# Patient Record
Sex: Male | Born: 1955 | Race: White | Hispanic: No | Marital: Married | Smoking: Former smoker
Health system: Southern US, Community
[De-identification: ages and names within clinical notes are randomized; demographics above are authoritative.]

## PROBLEM LIST (undated history)

## (undated) DIAGNOSIS — Z973 Presence of spectacles and contact lenses: Secondary | ICD-10-CM

## (undated) DIAGNOSIS — Z789 Other specified health status: Secondary | ICD-10-CM

## (undated) DIAGNOSIS — Z86010 Personal history of colonic polyps: Secondary | ICD-10-CM

## (undated) DIAGNOSIS — K604 Rectal fistula, unspecified: Secondary | ICD-10-CM

## (undated) DIAGNOSIS — Z860101 Personal history of adenomatous and serrated colon polyps: Secondary | ICD-10-CM

## (undated) HISTORY — PX: TONSILLECTOMY: SUR1361

## (undated) HISTORY — PX: COLONOSCOPY: SHX174

## (undated) HISTORY — PX: TONSILLECTOMY AND ADENOIDECTOMY: SUR1326

---

## 2003-11-08 ENCOUNTER — Ambulatory Visit (HOSPITAL_COMMUNITY): Admission: RE | Admit: 2003-11-08 | Discharge: 2003-11-08 | Payer: Self-pay | Admitting: Internal Medicine

## 2008-01-19 ENCOUNTER — Ambulatory Visit: Payer: Self-pay | Admitting: Internal Medicine

## 2008-01-19 ENCOUNTER — Ambulatory Visit (HOSPITAL_COMMUNITY): Admission: RE | Admit: 2008-01-19 | Discharge: 2008-01-19 | Payer: Self-pay | Admitting: Internal Medicine

## 2011-02-09 NOTE — Op Note (Signed)
NAMEJONA, ZAPPONE                 ACCOUNT NO.:  [id number]   MEDICAL RECORD NO.:  [id number]          PATIENT TYPE:  AMB   LOCATION:  DAY                           FACILITY:  [facility]   PHYSICIAN:  R. Roetta Sessions, M.D. DATE OF BIRTH:  30-Nov-1955   DATE OF PROCEDURE:  01/19/2008  DATE OF DISCHARGE:                               OPERATIVE REPORT   INDICATIONS FOR PROCEDURE:  This is a 55 year old gentleman who presents  for high-risk screening colonoscopy.  His last colonoscopy was just 5  years ago which was normal.  He has a marked positive family history of  a colon and non-GI cancers in his family at relatively young ages and at  least some of his relatives have been diagnosed with Lynch syndrome.  He  himself has not had genetic testing/counseling as of yet.  He has no  lower GI tract symptoms.  Colonoscopy is now being done.  This approach  has been discussed with the patient at length.  Potential risks,  benefits, alternatives, and limitations have been reviewed.  Please see  documentation in medical record.   PROCEDURE NOTE:  O2 saturation, blood pressure, pulse, and respirations  were monitored throughout the entire procedure.   CONSCIOUS SEDATION:  Versed 4 mg IV, Demerol 75 mg IV in divided doses.   INSTRUMENT:  Pentax video chip system.   FINDINGS:  Digital rectal exam revealed no abnormalities.  The scope was  placed.  Prep was good.  Colon:  Colonic mucosa was surveyed from the  rectosigmoid junction through the left transverse,  right colon to  appendiceal orifice, ileocecal valve, and cecum.  These structures were  well seen and photographed for the record.  Terminal ileum was intubated  10 cm from this level.  Scope was slowly withdrawn. All previously  mentioned mucosal surfaces were again seen.  The patient had diffusely  pigmented latent colonic mucosa, however, the remainder colonic mucosa  appeared normal as did the terminal ileum mucosa.  The scope was then  pulled down the rectum where thorough examination of rectal mucosa  including retroflex view of the anal verge demonstrated no  abnormalities.  The patient tolerated the procedure well and was  reactive to endoscopy.   IMPRESSION:  1. 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  Otherwise, the rectum and colonic mucosa appeared      normal.  2. Normal terminal ileum.   RECOMMENDATIONS:  Mr. Brannan has provided me with a typed list of  numerous family members who have succumbed to colon cancer and non-GI  cancers at relatively young ages and this is a striking family history.  Recommendations at this point, he needs at least a repeat screening  colonoscopy in 5 years.  He may need shorter-interval colonoscopy, but  Mr. Colgan is a individual who needs to go down to [facility] and have genetic  testing/counseling and my office will facilitate that process.      Jonathon Bellows, M.D.  Electronically Signed     RMR/MEDQ  D:  01/19/2008  T:  01/20/2008  Job:  [id number]   cc:   Corrie Mckusick, M.D.  Fax: [phone]

## 2011-02-12 NOTE — Op Note (Signed)
NAME:  Duane Porter, Duane Porter                           ACCOUNT NO.:  [id number]   MEDICAL RECORD NO.:  [id number]                   PATIENT TYPE:  AMB   LOCATION:  DAY                                  FACILITY:  [facility]   PHYSICIAN:  R. Roetta Sessions, M.D.              DATE OF BIRTH:  1955/11/28   DATE OF PROCEDURE:  11/08/2003  DATE OF DISCHARGE:                                 OPERATIVE REPORT   PROCEDURE:  High-risk screening colonoscopy.   ENDOSCOPIST:  Gerrit Friends. Rourk, M.D.   INDICATIONS FOR PROCEDURE:  The patient is a 55 year old gentleman with a  marked positive family history of colorectal cancer on his father's side.  His father recently underwent a surgical resection of a large precancerous  polyp.  Multiple family members, on his side, with a colorectal cancer.  [name] is devoid of any lower GI tract symptoms. He has never had a  colonoscopy.  Colonoscopy is now being done as a screening maneuver.  This  approach has been discussed with the patient at length.  The potential  risks, benefits, and alternatives have been reviewed, questions answered.  He is agreeable.  Please see my handwritten H&P for more information.   PROCEDURE NOTE:  O2 saturation, blood pressure, pulse and respirations were  monitored throughout the entire procedure.  Conscious sedation: IV Versed and Demerol in incremental doses.   INSTRUMENT:  Olympus video chip adult colonoscope.   FINDINGS:  Digital rectal exam revealed no abnormalities.   ENDOSCOPIC FINDINGS:  The prep was excellent.   RECTUM:  Examination of the rectal mucosa including the retroflexion view of  the anal verge revealed no abnormalities.   COLON:  The colonic mucosa was surveyed from the rectosigmoid junction  through the left transverse and right colon to the area of the appendiceal  orifice, ileocecal valve, and cecum.  These structures were well seen and  photographed for the record.  The terminal ileum was intubated to 10  cm.   From this level the scope was slowly withdrawn.  All previously mentioned  mucosal surfaces were again seen.  The colonic mucosa appeared entirely  normal.  A __________ pattern was somewhat prominent, but otherwise mucosa  appeared normal.  Normal terminal ileum.  The patient tolerated the  procedure well and was reacted in endoscopy.   IMPRESSION:  1. Normal rectum.  2. Normal colon.  3. Normal terminal ileum.   RECOMMENDATIONS:  1. Repeat colonoscopy in 5 years.  2. I have recommended his family members be assessed and screened as     appropriate.      ___________________________________________                                            Jonathon Bellows, M.D.   RMR/MEDQ  D:  11/08/2003  T:  11/08/2003  Job:  [id number]   cc:   R. Roetta Sessions, M.D.  [address]  [place]  [place] [address]  Fax: [phone]   Corrie Mckusick, M.D.  [address] [place]s. [place]  [place]  [place] [address]  Fax: [phone]

## 2013-02-20 ENCOUNTER — Encounter: Payer: Self-pay | Admitting: General Practice

## 2013-06-19 ENCOUNTER — Telehealth: Payer: Self-pay

## 2013-06-19 NOTE — Telephone Encounter (Signed)
Pt was referred by Dr. Sherwood Gambler for screening colonoscopy. ( He also received a letter from here also). Last TCS 01/19/2008. Family hx of colon cancer. LMOM to call and schedule OV prior to scheduling colonoscopy.

## 2013-07-02 NOTE — Telephone Encounter (Signed)
LMOM again for a call. Needs OV.

## 2013-07-03 ENCOUNTER — Telehealth: Payer: Self-pay

## 2013-07-03 NOTE — Telephone Encounter (Signed)
See separate triage dated 07/03/2013.

## 2013-07-03 NOTE — Telephone Encounter (Signed)
Gastroenterology Pre-Procedure Review  Request Date: 07/03/2013 Requesting Physician: Dr. Sherwood Gambler  Pt's last TCS was 01/19/2008 by RMR. Next was recommended in 5 years. FH+CRC Pt said he is healthy and takes no medications and has a high deductible insurance He would like to just be triaged and scheduled for colonscopy He does drink about 6-8 drinks weekly  ( Such as tequila)   PATIENT REVIEW QUESTIONS: The patient responded to the following health history questions as indicated:    1. Diabetes Melitis: no 2. Joint replacements in the past 12 months: no 3. Major health problems in the past 3 months: no 4. Has an artificial valve or MVP: no 5. Has a defibrillator: no 6. Has been advised in past to take antibiotics in advance of a procedure like teeth cleaning: no    MEDICATIONS & ALLERGIES:    Patient reports the following regarding taking any blood thinners:   Plavix? no Aspirin? no Coumadin? no  Patient confirms/reports the following medications:  No current outpatient prescriptions on file.   No current facility-administered medications for this visit.    Patient confirms/reports the following allergies:  No Known Allergies  No orders of the defined types were placed in this encounter.    AUTHORIZATION INFORMATION Primary Insurance:  ID #:   Group #:  Pre-Cert / Auth required:  Pre-Cert / Auth #:   Secondary Insurance:   ID #:   Group #: Pre-Cert / Auth required Pre-Cert / Auth #:   SCHEDULE INFORMATION: Procedure has been scheduled as follows:  Date:            Time:   Location:   This Gastroenterology Pre-Precedure Review Form is being routed to the following provider(s): R. Roetta Sessions, MD

## 2013-07-03 NOTE — Telephone Encounter (Signed)
LMOM to call.

## 2013-07-04 ENCOUNTER — Other Ambulatory Visit: Payer: Self-pay

## 2013-07-04 DIAGNOSIS — Z1211 Encounter for screening for malignant neoplasm of colon: Secondary | ICD-10-CM

## 2013-07-04 MED ORDER — PEG-KCL-NACL-NASULF-NA ASC-C 100 G PO SOLR: 1.0000 | ORAL | Status: DC

## 2013-07-04 NOTE — Telephone Encounter (Signed)
Pt called back and is scheduled for 1:00 PM on 07/19/2013. Instructions mailed to pt and Rx sent to the pharmacy.

## 2013-07-04 NOTE — Telephone Encounter (Signed)
OK to triage him. Previously did well with conscious sedation.

## 2013-07-10 ENCOUNTER — Encounter (HOSPITAL_COMMUNITY): Payer: Self-pay | Admitting: Pharmacy Technician

## 2013-07-11 ENCOUNTER — Telehealth: Payer: Self-pay

## 2013-07-11 NOTE — Telephone Encounter (Signed)
I called [employer] at [phone] and spoke to [place] N who said that a PA is not required for screening colonoscopy.

## 2013-07-13 ENCOUNTER — Telehealth: Payer: Self-pay

## 2013-07-13 NOTE — Telephone Encounter (Signed)
Noted  

## 2013-07-13 NOTE — Telephone Encounter (Signed)
LATE ENTRY:  Pt came by the office at 4:45 pm yesterday and said he would like to cancel his appt for colonoscopy on 07/19/2013. He said he will call me in Jan 2015 and reschedule. He said due to his insurance now, he would have to pay a lot and he will have a change in Jan. He is not having any problems. I took him off my schedule. Put on my reminder list to call in Jan if I do not hear from him.

## 2013-07-13 NOTE — Telephone Encounter (Signed)
Letter faxed to Dr. Sherwood Gambler of the cancellation.

## 2013-07-13 NOTE — Telephone Encounter (Signed)
Kim is aware

## 2013-07-19 ENCOUNTER — Encounter (HOSPITAL_COMMUNITY): Admission: RE | Payer: Self-pay | Source: Ambulatory Visit

## 2013-07-19 ENCOUNTER — Ambulatory Visit (HOSPITAL_COMMUNITY): Admission: RE | Admit: 2013-07-19 | Payer: 59 | Source: Ambulatory Visit | Admitting: Internal Medicine

## 2013-07-19 SURGERY — COLONOSCOPY
Anesthesia: Moderate Sedation

## 2013-07-24 ENCOUNTER — Ambulatory Visit: Payer: Self-pay | Admitting: Gastroenterology

## 2013-10-10 NOTE — Telephone Encounter (Signed)
Letter reminder mailed to pt to call and schedule colonoscopy.  

## 2013-10-11 ENCOUNTER — Other Ambulatory Visit: Payer: Self-pay

## 2013-10-11 ENCOUNTER — Telehealth: Payer: Self-pay | Admitting: Internal Medicine

## 2013-10-11 DIAGNOSIS — Z1211 Encounter for screening for malignant neoplasm of colon: Secondary | ICD-10-CM

## 2013-10-11 NOTE — Telephone Encounter (Signed)
Pt called to speak with the nurse that schedules colonoscopies and I told him that DS wasn't available at the moment and I could transfer him to her VM. He wanted to know how long it would take for her to become available. I told him that she was with a patient and I don't know how long that would take. He said that he would stay on hold because he isn't always at his desk either. I place patient on hold and after a few minutes of waiting I had to transfer him to VM.

## 2013-10-12 ENCOUNTER — Telehealth: Payer: Self-pay

## 2013-10-12 MED ORDER — PEG-KCL-NACL-NASULF-NA ASC-C 100 G PO SOLR: 1.0000 | ORAL | Status: DC

## 2013-10-12 NOTE — Telephone Encounter (Signed)
Called pt. He said he did not have the genetic testing. He is not really sure what happened, he thinks that he just forgot about it.

## 2013-10-12 NOTE — Telephone Encounter (Signed)
Gastroenterology Pre-Procedure Review  Request Date:10/11/2013 Requesting Physician: just time for next colonoscopy Last one was 01/19/2008 by Dr. Gala Romney and pt has family hx of colon cancer and family hx of precancerous polyps Not having any problems  PATIENT REVIEW QUESTIONS: The patient responded to the following health history questions as indicated:    1. Diabetes Melitis: no 2. Joint replacements in the past 12 months: no 3. Major health problems in the past 3 months: no 4. Has an artificial valve or MVP: no 5. Has a defibrillator: no 6. Has been advised in past to take antibiotics in advance of a procedure like teeth cleaning: no    MEDICATIONS & ALLERGIES:    Patient reports the following regarding taking any blood thinners:   Plavix? no Aspirin? no Coumadin? no  Patient confirms/reports the following medications:  Current Outpatient Prescriptions  Medication Sig Dispense Refill  . peg 3350 powder (MOVIPREP) 100 G SOLR Take 1 kit (200 g total) by mouth as directed.  1 kit  0   No current facility-administered medications for this visit.    Patient confirms/reports the following allergies:  No Known Allergies  No orders of the defined types were placed in this encounter.    AUTHORIZATION INFORMATION Primary Insurance:   ID #:  Group #:  Pre-Cert / Auth required:  Pre-Cert / Auth #:   Secondary Insurance:  ID #: Group #:  Pre-Cert / Auth required: Pre-Cert / Auth #:   SCHEDULE INFORMATION: Procedure has been scheduled as follows:  Date: 10/29/2013              Time:  8:30 AM Location: [facility] Short Stay  This Gastroenterology Pre-Precedure Review Form is being routed to the following provider(s): R. Garfield Cornea, MD

## 2013-10-12 NOTE — Telephone Encounter (Signed)
OK. Do we know if he ever went to [facility] for genetic testing due to his family history and per RMR recommendations after his last TCS?

## 2013-10-15 ENCOUNTER — Encounter (HOSPITAL_COMMUNITY): Payer: Self-pay | Admitting: Pharmacy Technician

## 2013-10-15 NOTE — Telephone Encounter (Signed)
OK to schedule.  Let's wait for TCS and RMR's input on referral for genetic testing.

## 2013-10-18 NOTE — Telephone Encounter (Signed)
Instructions mailed to pt. Per Jimmey Ralph at [place] the Rx is still on profile for the previous Movie Prep.

## 2013-10-18 NOTE — Telephone Encounter (Signed)
See separate triage.  

## 2013-10-29 ENCOUNTER — Encounter (HOSPITAL_COMMUNITY)
Admission: RE | Disposition: A | Discharge status: Home Health | Payer: Self-pay | Source: Ambulatory Visit | Attending: Internal Medicine

## 2013-10-29 ENCOUNTER — Encounter (HOSPITAL_COMMUNITY): Payer: Self-pay

## 2013-10-29 ENCOUNTER — Ambulatory Visit (HOSPITAL_COMMUNITY)
Admission: RE | Admit: 2013-10-29 | Discharge: 2013-10-29 | Disposition: A | Discharge status: Home Health | Payer: 59 | Source: Ambulatory Visit | Attending: Internal Medicine | Admitting: Internal Medicine

## 2013-10-29 DIAGNOSIS — D126 Benign neoplasm of colon, unspecified: Secondary | ICD-10-CM | POA: Insufficient documentation

## 2013-10-29 DIAGNOSIS — Z800 Family history of malignant neoplasm of digestive organs: Secondary | ICD-10-CM

## 2013-10-29 DIAGNOSIS — Z1211 Encounter for screening for malignant neoplasm of colon: Secondary | ICD-10-CM

## 2013-10-29 HISTORY — DX: Other specified health status: Z78.9

## 2013-10-29 HISTORY — PX: COLONOSCOPY: SHX5424

## 2013-10-29 SURGERY — COLONOSCOPY
Anesthesia: Moderate Sedation

## 2013-10-29 MED ORDER — ONDANSETRON HCL 4 MG/2ML IJ SOLN
INTRAMUSCULAR | Status: AC
  Filled 2013-10-29: qty 2

## 2013-10-29 MED ORDER — MIDAZOLAM HCL 5 MG/5ML IJ SOLN
INTRAMUSCULAR | Status: AC
  Filled 2013-10-29: qty 10

## 2013-10-29 MED ORDER — SODIUM CHLORIDE 0.9 % IV SOLN
INTRAVENOUS | Status: DC
  Administered 2013-10-29: 08:00:00 via INTRAVENOUS

## 2013-10-29 MED ORDER — MIDAZOLAM HCL 5 MG/5ML IJ SOLN
INTRAMUSCULAR | Status: DC | PRN
  Administered 2013-10-29 (×2): 1 mg via INTRAVENOUS
  Administered 2013-10-29: 2 mg via INTRAVENOUS

## 2013-10-29 MED ORDER — MEPERIDINE HCL 100 MG/ML IJ SOLN
INTRAMUSCULAR | Status: AC
  Filled 2013-10-29: qty 2

## 2013-10-29 MED ORDER — ONDANSETRON HCL 4 MG/2ML IJ SOLN
INTRAMUSCULAR | Status: DC | PRN
  Administered 2013-10-29: 4 mg via INTRAVENOUS

## 2013-10-29 MED ORDER — STERILE WATER FOR IRRIGATION IR SOLN
Status: DC | PRN
  Administered 2013-10-29: 09:00:00

## 2013-10-29 MED ORDER — MEPERIDINE HCL 100 MG/ML IJ SOLN
INTRAMUSCULAR | Status: DC | PRN
  Administered 2013-10-29: 50 mg via INTRAVENOUS
  Administered 2013-10-29: 25 mg via INTRAVENOUS

## 2013-10-29 NOTE — Op Note (Signed)
[facility] [address] [place], [address]   COLONOSCOPY PROCEDURE REPORT  PATIENT: Duane, Porter  MR#:         [id number] BIRTHDATE: [birth year] , 62  yrs. old GENDER: Male ENDOSCOPIST: R.  Garfield Cornea, MD FACP FACG REFERRED BY:  Kerin Perna, M.D. PROCEDURE DATE:  10/29/2013 PROCEDURE:     Ileocolonoscopy with biopsy  INDICATIONS: High-risk screening - Family history of colonic polyps and colon cancer  INFORMED CONSENT:  The risks, benefits, alternatives and imponderables including but not limited to bleeding, perforation as well as the possibility of a missed lesion have been reviewed.  The potential for biopsy, lesion removal, etc. have also been discussed.  Questions have been answered.  All parties agreeable. Please see the history and physical in the medical record for more information.  MEDICATIONS: Versed 4 mg IV and Demerol 75 mg IV in divided doses. Zofran 4 mg IV  DESCRIPTION OF PROCEDURE:  After a digital rectal exam was performed, the EC-3890Li ([id number])  colonoscope was advanced from the anus through the rectum and colon to the area of the cecum, ileocecal valve and appendiceal orifice.  The cecum was deeply intubated.  These structures were well-seen and photographed for the record.  From the level of the cecum and ileocecal valve, the scope was slowly and cautiously withdrawn.  The mucosal surfaces were carefully surveyed utilizing scope tip deflection to facilitate fold flattening as needed.  The scope was pulled down into the rectum where a thorough examination including retroflexion was performed.    FINDINGS:  Adequate preparation. Normal rectum. (1) diminutive polyp in the mid descending segment; (1) diminutive polyp in the base the cecum; otherwise, the remainder of the colonic mucosa appeared normal. The distal 5 cm of terminal ileal mucosa also appeared normal.  THERAPEUTIC / DIAGNOSTIC MANEUVERS PERFORMED:  The  above-mentioned polyps were cold biopsied/removed.  COMPLICATIONS: None  CECAL WITHDRAWAL TIME:  11 minutes  IMPRESSION:  Colonic polyps  RECOMMENDATIONS:  Follow up on pathology. Recommend seeing a genetic counselor to consider genetic testing, if appropriate, given family history _______________________________ eSigned:  R. Garfield Cornea, MD FACP [facility] 10/29/2013 9:11 AM   CC:

## 2013-10-29 NOTE — H&P (Signed)
  Primary Care Physician:  Glo Herring., MD Primary Gastroenterologist:  Dr. Gala Romney  Pre-Procedure History & Physical: HPI:  Duane Porter is a 58 y.o. male is here for a screening colonoscopy. Father with polyps. Paternal aunt with colon cancer. No GI symptoms. Negative colonoscopy a number of years ago.  Past Medical History  Diagnosis Date  . Medical history non-contributory     Past Surgical History  Procedure Laterality Date  . Tonsillectomy      at 58 years old  . Colonoscopy  2010, 2005    Prior to Admission medications   Not on File    Allergies as of 10/11/2013  . (No Known Allergies)    Family History  Problem Relation Age of Onset  . Colon cancer Other   . Colon cancer Paternal Aunt     History   Social History  . Marital Status: Married    Spouse Name: N/A    Number of Children: N/A  . Years of Education: N/A   Occupational History  . Not on file.   Social History Main Topics  . Smoking status: Never Smoker   . Smokeless tobacco: Not on file  . Alcohol Use: No  . Drug Use: No  . Sexual Activity: Not on file   Other Topics Concern  . Not on file   Social History Narrative  . No narrative on file    Review of Systems: See HPI, otherwise negative ROS  Physical Exam: BP 119/81  Pulse 61  Temp(Src) 97.9 F (36.6 C) (Oral)  Resp 16  Ht 5\' 11"  (1.803 m)  Wt 163 lb (73.936 kg)  BMI 22.74 kg/m2  SpO2 95% General:   Alert,  Well-developed, well-nourished, pleasant and cooperative in NAD Head:  Normocephalic and atraumatic. Eyes:  Sclera clear, no icterus.   Conjunctiva pink. Ears:  Normal auditory acuity. Nose:  No deformity, discharge,  or lesions. Mouth:  No deformity or lesions, dentition normal. Neck:  Supple; no masses or thyromegaly. Lungs:  Clear throughout to auscultation.   No wheezes, crackles, or rhonchi. No acute distress. Heart:  Regular rate and rhythm; no murmurs, clicks, rubs,  or gallops. Abdomen:  Soft, nontender  and nondistended. No masses, hepatosplenomegaly or hernias noted. Normal bowel sounds, without guarding, and without rebound.   Msk:  Symmetrical without gross deformities. Normal posture. Pulses:  Normal pulses noted. Extremities:  Without clubbing or edema. Neurologic:  Alert and  oriented x4;  grossly normal neurologically. Skin:  Intact without significant lesions or rashes. Cervical Nodes:  No significant cervical adenopathy. Psych:  Alert and cooperative. Normal mood and affect.  Impression/Plan: Duane Porter is now here to undergo a screening colonoscopy.  Risks, benefits, limitations, imponderables and alternatives regarding colonoscopy have been reviewed with the patient. Questions have been answered. All parties agreeable.

## 2013-10-29 NOTE — Discharge Instructions (Addendum)
Colonoscopy Discharge Instructions  Read the instructions outlined below and refer to this sheet in the next few weeks. These discharge instructions provide you with general information on caring for yourself after you leave the hospital. Your doctor may also give you specific instructions. While your treatment has been planned according to the most current medical practices available, unavoidable complications occasionally occur. If you have any problems or questions after discharge, call Dr. Gala Romney at [phone]. ACTIVITY  You may resume your regular activity, but move at a slower pace for the next 24 hours.   Take frequent rest periods for the next 24 hours.   Walking will help get rid of the air and reduce the bloated feeling in your belly (abdomen).   No driving for 24 hours (because of the medicine (anesthesia) used during the test).    Do not sign any important legal documents or operate any machinery for 24 hours (because of the anesthesia used during the test).  NUTRITION  Drink plenty of fluids.   You may resume your normal diet as instructed by your doctor.   Begin with a light meal and progress to your normal diet. Heavy or fried foods are harder to digest and may make you feel sick to your stomach (nauseated).   Avoid alcoholic beverages for 24 hours or as instructed.  MEDICATIONS  You may resume your normal medications unless your doctor tells you otherwise.  WHAT YOU CAN EXPECT TODAY  Some feelings of bloating in the abdomen.   Passage of more gas than usual.   Spotting of blood in your stool or on the toilet paper.  IF YOU HAD POLYPS REMOVED DURING THE COLONOSCOPY:  No aspirin products for 7 days or as instructed.   No alcohol for 7 days or as instructed.   Eat a soft diet for the next 24 hours.  FINDING OUT THE RESULTS OF YOUR TEST Not all test results are available during your visit. If your test results are not back during the visit, make an appointment  with your caregiver to find out the results. Do not assume everything is normal if you have not heard from your caregiver or the medical facility. It is important for you to follow up on all of your test results.  SEEK IMMEDIATE MEDICAL ATTENTION IF:  You have more than a spotting of blood in your stool.   Your belly is swollen (abdominal distention).   You are nauseated or vomiting.   You have a temperature over 101.   You have abdominal pain or discomfort that is severe or gets worse throughout the day.     Get genetic testing as previously recommended  Polyp information provided  Further recommendations to follow pending review of pathology  Colon Polyps Polyps are lumps of extra tissue growing inside the body. Polyps can grow in the large intestine (colon). Most colon polyps are noncancerous (benign). However, some colon polyps can become cancerous over time. Polyps that are larger than a pea may be harmful. To be safe, caregivers remove and test all polyps. CAUSES  Polyps form when mutations in the genes cause your cells to grow and divide even though no more tissue is needed. RISK FACTORS There are a number of risk factors that can increase your chances of getting colon polyps. They include:  Being older than 50 years.  Family history of colon polyps or colon cancer.  Long-term colon diseases, such as colitis or Crohn disease.  Being overweight.  Smoking.  Being  inactive.  Drinking too much alcohol. SYMPTOMS  Most small polyps do not cause symptoms. If symptoms are present, they may include:  Blood in the stool. The stool may look dark red or black.  Constipation or diarrhea that lasts longer than 1 week. DIAGNOSIS People often do not know they have polyps until their caregiver finds them during a regular checkup. Your caregiver can use 4 tests to check for polyps:  Digital rectal exam. The caregiver wears gloves and feels inside the rectum. This test would  find polyps only in the rectum.  Barium enema. The caregiver puts a liquid called barium into your rectum before taking X-rays of your colon. Barium makes your colon look white. Polyps are dark, so they are easy to see in the X-ray pictures.  Sigmoidoscopy. A thin, flexible tube (sigmoidoscope) is placed into your rectum. The sigmoidoscope has a light and tiny camera in it. The caregiver uses the sigmoidoscope to look at the last third of your colon.  Colonoscopy. This test is like sigmoidoscopy, but the caregiver looks at the entire colon. This is the most common method for finding and removing polyps. TREATMENT  Any polyps will be removed during a sigmoidoscopy or colonoscopy. The polyps are then tested for cancer. PREVENTION  To help lower your risk of getting more colon polyps:  Eat plenty of fruits and vegetables. Avoid eating fatty foods.  Do not smoke.  Avoid drinking alcohol.  Exercise every day.  Lose weight if recommended by your caregiver.  Eat plenty of calcium and folate. Foods that are rich in calcium include milk, cheese, and broccoli. Foods that are rich in folate include chickpeas, kidney beans, and spinach. HOME CARE INSTRUCTIONS Keep all follow-up appointments as directed by your caregiver. You may need periodic exams to check for polyps. SEEK MEDICAL CARE IF: You notice bleeding during a bowel movement. Document Released: 06/09/2004 Document Revised: 12/06/2011 Document Reviewed: 11/23/2011 [facility] Patient Information 2014 [place].

## 2013-10-31 ENCOUNTER — Telehealth: Payer: Self-pay | Admitting: Internal Medicine

## 2013-10-31 ENCOUNTER — Encounter: Payer: Self-pay | Admitting: Internal Medicine

## 2013-10-31 NOTE — Telephone Encounter (Signed)
Pt came into office a few minutes before closing looking to speak with RMR. I told patient that RMR was not in the office and how could I help him. He said that he had a procedure the day before and was some what mouthy to RMR about removing polyps. Patient said that he doesn't remember anything just what his wife told him. He said the anesthesia must have made him that way and he wanted to apologize to RMR and he was OK about him removing the polyps. I assured patient that I was sure the our doctors get this a lot due to anesthesia and not to worry and he would be hearing from [place] soon regarding his results. Patient said thank you and he left the office.

## 2013-10-31 NOTE — Telephone Encounter (Signed)
Interesting report. I do not remember any irregularities or comments from him. Patient should not worry about his behavior at the hospital

## 2013-11-01 ENCOUNTER — Encounter (HOSPITAL_COMMUNITY): Payer: Self-pay | Admitting: Internal Medicine

## 2018-07-03 ENCOUNTER — Encounter: Payer: Self-pay | Admitting: Internal Medicine

## 2018-10-30 ENCOUNTER — Ambulatory Visit (INDEPENDENT_AMBULATORY_CARE_PROVIDER_SITE_OTHER): Payer: Self-pay

## 2018-10-30 DIAGNOSIS — Z1211 Encounter for screening for malignant neoplasm of colon: Secondary | ICD-10-CM

## 2018-10-30 MED ORDER — NA SULFATE-K SULFATE-MG SULF 17.5-3.13-1.6 GM/177ML PO SOLN: 1.0000 | ORAL | 0 refills | Status: DC

## 2018-10-30 NOTE — Progress Notes (Signed)
Gastroenterology Pre-Procedure Review  Request Date:10/30/18 Requesting Physician: Dr.Fusco- last tcs- 10/29/13- RMR- benign colonic mucosa  +FH CRC  PATIENT REVIEW QUESTIONS: The patient responded to the following health history questions as indicated:    1. Diabetes Melitis: no 2. Joint replacements in the past 12 months: no 3. Major health problems in the past 3 months: no 4. Has an artificial valve or MVP: no 5. Has a defibrillator: no 6. Has been advised in past to take antibiotics in advance of a procedure like teeth cleaning: no 7. Family history of colon cancer: yes (father )  51. Alcohol Use: yes (socially) 9. History of sleep apnea: no  10. History of coronary artery or other vascular stents placed within the last 12 months: no 11. History of any prior anesthesia complications: no    MEDICATIONS & ALLERGIES:    Patient reports the following regarding taking any blood thinners:   Plavix? no Aspirin? no Coumadin? no Brilinta? no Xarelto? no Eliquis? no Pradaxa? no Savaysa? no Effient? no  Patient confirms/reports the following medications:  Current Outpatient Medications  Medication Sig Dispense Refill  . sulfamethoxazole-trimethoprim (BACTRIM DS,SEPTRA DS) 800-160 MG tablet TAKE 2 TABLETS BY MOUTH TWICE DAILY FOR 10 14 DAYS     No current facility-administered medications for this visit.     Patient confirms/reports the following allergies:  No Known Allergies  No orders of the defined types were placed in this encounter.   AUTHORIZATION INFORMATION Primary Insurance: [place],  [place] #: [id number] Pre-Cert / Josem Kaufmann required: no   SCHEDULE INFORMATION: Procedure has been scheduled as follows:  Date: 12/29/18, Time: 12:00 Location: [facility] Dr.Rourk  This Gastroenterology Pre-Precedure Review Form is being routed to the following provider(s): Walden Field NP

## 2018-10-30 NOTE — Patient Instructions (Addendum)
Duane Porter  1956/07/18 MRN: [id number]     Procedure Date: 02/21/2019 Time to register: 1:00pm Place to register: [facility] Procedure Time: 2:00pm Scheduled provider: R. Garfield Cornea, MD    PREPARATION FOR COLONOSCOPY WITH SUPREP BOWEL PREP KIT  Note: Suprep Bowel Prep Kit is a split-dose (2day) regimen. Consumption of BOTH 6-ounce bottles is required for a complete prep.  Please notify us immediately if you are diabetic, take iron supplements, or if you are on Coumadin or any other blood thinners.                                                                                                                                                 2 DAYS BEFORE PROCEDURE:  DATE: 02/19/2019   DAY: Monday Begin clear liquid diet AFTER your lunch meal. NO SOLID FOODS after this point.  1 DAY BEFORE PROCEDURE:  DATE: 02/20/2019   DAY: Tuesday Continue clear liquids the entire day - NO SOLID FOOD.    At 6:00pm: Complete steps 1 through 4 below, using ONE (1) 6-ounce bottle, before going to bed. Step 1:  Pour ONE (1) 6-ounce bottle of SUPREP liquid into the mixing container.  Step 2:  Add cool drinking water to the 16 ounce line on the container and mix.  Note: Dilute the solution concentrate as directed prior to use. Step 3:  DRINK ALL the liquid in the container. Step 4:  You MUST drink an additional two (2) or more 16 ounce containers of water over the next one (1) hour.   Continue clear liquids.  DAY OF PROCEDURE:   DATE: 02/21/2019   DAY: Wednesday If you take medications for your heart, blood pressure, or breathing, you may take these medications.   5 hours before your procedure at 9:00am Step 1:  Pour ONE (1) 6-ounce bottle of SUPREP liquid into the mixing container.  Step 2:  Add cool drinking water to the 16 ounce line on the container and mix.  Note: Dilute the solution concentrate as directed prior to use. Step 3:  DRINK ALL the liquid in the container. Step 4:  You  MUST drink an additional two (2) or more 16 ounce containers of water over the next one (1) hour. You MUST complete the final glass of water at least 3 hours before your colonoscopy.   Nothing by mouth past 11:00am  You may take your morning medications with sip of water unless we have instructed otherwise.    Please see below for Dietary Information.  CLEAR LIQUIDS INCLUDE:  Water Jello (NOT red in color)   Ice Popsicles (NOT red in color)   Tea (sugar ok, no milk/cream) Powdered fruit flavored drinks  Coffee (sugar ok, no milk/cream) Gatorade/ Lemonade/ Kool-Aid  (NOT red in color)   Juice: apple, white grape, white cranberry Soft drinks  Clear bullion, consomme, broth (fat free beef/chicken/vegetable)  Carbonated beverages (any kind)  Strained chicken noodle soup Hard Candy   Remember: Clear liquids are liquids that will allow you to see your fingers on the other side of a clear glass. Be sure liquids are NOT red in color, and not cloudy, but CLEAR.  DO NOT EAT OR DRINK ANY OF THE FOLLOWING:  Dairy products of any kind   Cranberry juice Tomato juice / V8 juice   Grapefruit juice Orange juice     Red grape juice  Do not eat any solid foods, including such foods as: cereal, oatmeal, yogurt, fruits, vegetables, creamed soups, eggs, bread, crackers, pureed foods in a blender, etc.   HELPFUL HINTS FOR DRINKING PREP SOLUTION:   Make sure prep is extremely cold. Mix and refrigerate the the morning of the prep. You may also put in the freezer.   You may try mixing some Crystal Light or Country Time Lemonade if you prefer. Mix in small amounts; add more if necessary.  Try drinking through a straw  Rinse mouth with water or a mouthwash between glasses, to remove after-taste.  Try sipping on a cold beverage /ice/ popsicles between glasses of prep.  Place a piece of sugar-free hard candy in mouth between glasses.  If you become nauseated, try consuming smaller amounts, or stretch  out the time between glasses. Stop for 30-60 minutes, then slowly start back drinking.     OTHER INSTRUCTIONS  You will need a responsible adult at least 61 years of age to accompany you and drive you home. This person must remain in the waiting room during your procedure. The hospital will cancel your procedure if you do not have a responsible adult with you.   1. Wear loose fitting clothing that is easily removed. 2. Leave jewelry and other valuables at home.  3. Remove all body piercing jewelry and leave at home. 4. Total time from sign-in until discharge is approximately 2-3 hours. 5. You should go home directly after your procedure and rest. You can resume normal activities the day after your procedure. 6. The day of your procedure you should not:  Drive  Make legal decisions  Operate machinery  Drink alcohol  Return to work   You may call the office (Dept: [phone]) before 5:00pm, or page the doctor on call ([phone]) after 5:00pm, for further instructions, if necessary.   Insurance Information YOU WILL NEED TO CHECK WITH YOUR INSURANCE COMPANY FOR THE BENEFITS OF COVERAGE YOU HAVE FOR THIS PROCEDURE.  UNFORTUNATELY, NOT ALL INSURANCE COMPANIES HAVE BENEFITS TO COVER ALL OR PART OF THESE TYPES OF PROCEDURES.  IT IS YOUR RESPONSIBILITY TO CHECK YOUR BENEFITS, HOWEVER, WE WILL BE GLAD TO ASSIST YOU WITH ANY CODES YOUR INSURANCE COMPANY MAY NEED.    PLEASE NOTE THAT MOST INSURANCE COMPANIES WILL NOT COVER A SCREENING COLONOSCOPY FOR PEOPLE UNDER THE AGE OF 50  IF YOU HAVE BCBS INSURANCE, YOU MAY HAVE BENEFITS FOR A SCREENING COLONOSCOPY BUT IF POLYPS ARE FOUND THE DIAGNOSIS WILL CHANGE AND THEN YOU MAY HAVE A DEDUCTIBLE THAT WILL NEED TO BE MET. SO PLEASE MAKE SURE YOU CHECK YOUR BENEFITS FOR A SCREENING COLONOSCOPY AS WELL AS A DIAGNOSTIC COLONOSCOPY.      

## 2018-10-31 NOTE — Progress Notes (Signed)
Ok to schedule.

## 2018-12-18 NOTE — Progress Notes (Signed)
Pt's colonoscopy was re-scheduled due to CDC guidelines regarding the coronavirus.  Pt aware that we are mailing out new instructions.  Endo notified.

## 2019-02-12 ENCOUNTER — Telehealth: Payer: Self-pay | Admitting: Internal Medicine

## 2019-02-12 NOTE — Telephone Encounter (Signed)
Pt was returning a call from [facility]. Please call him back at [phone]

## 2019-02-12 NOTE — Telephone Encounter (Signed)
Called pt back to confirm procedure for next week and to inform him about COVID 19 testing.

## 2019-02-16 ENCOUNTER — Other Ambulatory Visit: Payer: Self-pay

## 2019-02-16 ENCOUNTER — Other Ambulatory Visit (HOSPITAL_COMMUNITY)
Admission: RE | Admit: 2019-02-16 | Discharge: 2019-02-16 | Disposition: A | Discharge status: Home / Self Care | Payer: BC Managed Care – PPO | Source: Ambulatory Visit | Attending: Internal Medicine | Admitting: Internal Medicine

## 2019-02-16 DIAGNOSIS — Z1159 Encounter for screening for other viral diseases: Secondary | ICD-10-CM | POA: Diagnosis not present

## 2019-02-16 DIAGNOSIS — Z01812 Encounter for preprocedural laboratory examination: Secondary | ICD-10-CM | POA: Diagnosis present

## 2019-02-17 LAB — NOVEL CORONAVIRUS, NAA (HOSP ORDER, SEND-OUT TO REF LAB; TAT 18-24 HRS): SARS-CoV-2, NAA: NOT DETECTED

## 2019-02-20 ENCOUNTER — Telehealth: Payer: Self-pay | Admitting: *Deleted

## 2019-02-20 NOTE — Telephone Encounter (Signed)
Patient called back and stated he is not able to move up on schedule. Called endo and made aware

## 2019-02-20 NOTE — Telephone Encounter (Signed)
LMTCB for pt. Duane Porter in endo wants [place] to see if patient is willing to move procedure time up tomorrow on schedule to 8:15am.

## 2019-02-21 ENCOUNTER — Encounter (HOSPITAL_COMMUNITY): Payer: Self-pay | Admitting: *Deleted

## 2019-02-21 ENCOUNTER — Ambulatory Visit (HOSPITAL_COMMUNITY)
Admission: RE | Admit: 2019-02-21 | Discharge: 2019-02-21 | Disposition: A | Discharge status: Home / Self Care | Payer: BC Managed Care – PPO | Attending: Internal Medicine | Admitting: Internal Medicine

## 2019-02-21 ENCOUNTER — Encounter (HOSPITAL_COMMUNITY)
Admission: RE | Disposition: A | Discharge status: Home / Self Care | Payer: Self-pay | Source: Home / Self Care | Attending: Internal Medicine

## 2019-02-21 ENCOUNTER — Other Ambulatory Visit: Payer: Self-pay

## 2019-02-21 DIAGNOSIS — Z8371 Family history of colonic polyps: Secondary | ICD-10-CM | POA: Diagnosis not present

## 2019-02-21 DIAGNOSIS — Z1211 Encounter for screening for malignant neoplasm of colon: Secondary | ICD-10-CM | POA: Diagnosis not present

## 2019-02-21 DIAGNOSIS — Z800 Family history of malignant neoplasm of digestive organs: Secondary | ICD-10-CM | POA: Insufficient documentation

## 2019-02-21 DIAGNOSIS — D120 Benign neoplasm of cecum: Secondary | ICD-10-CM | POA: Insufficient documentation

## 2019-02-21 DIAGNOSIS — K635 Polyp of colon: Secondary | ICD-10-CM | POA: Diagnosis not present

## 2019-02-21 DIAGNOSIS — D122 Benign neoplasm of ascending colon: Secondary | ICD-10-CM | POA: Diagnosis not present

## 2019-02-21 HISTORY — PX: POLYPECTOMY: SHX5525

## 2019-02-21 HISTORY — PX: COLONOSCOPY: SHX5424

## 2019-02-21 SURGERY — COLONOSCOPY
Anesthesia: Moderate Sedation

## 2019-02-21 MED ORDER — MIDAZOLAM HCL 5 MG/5ML IJ SOLN
INTRAMUSCULAR | Status: DC | PRN
  Administered 2019-02-21: 2 mg via INTRAVENOUS
  Administered 2019-02-21: 1 mg via INTRAVENOUS
  Administered 2019-02-21: 2 mg via INTRAVENOUS
  Administered 2019-02-21 (×2): 1 mg via INTRAVENOUS

## 2019-02-21 MED ORDER — SODIUM CHLORIDE 0.9 % IV SOLN
INTRAVENOUS | Status: DC
  Administered 2019-02-21: 13:00:00 via INTRAVENOUS

## 2019-02-21 MED ORDER — STERILE WATER FOR IRRIGATION IR SOLN
Status: DC | PRN
  Administered 2019-02-21: 14:00:00 1.5 mL

## 2019-02-21 MED ORDER — ONDANSETRON HCL 4 MG/2ML IJ SOLN
INTRAMUSCULAR | Status: DC | PRN
  Administered 2019-02-21: 4 mg via INTRAVENOUS

## 2019-02-21 MED ORDER — MIDAZOLAM HCL 5 MG/5ML IJ SOLN
INTRAMUSCULAR | Status: AC
  Filled 2019-02-21: qty 10

## 2019-02-21 MED ORDER — MEPERIDINE HCL 100 MG/ML IJ SOLN
INTRAMUSCULAR | Status: DC | PRN
  Administered 2019-02-21 (×2): 25 mg via INTRAVENOUS

## 2019-02-21 MED ORDER — MEPERIDINE HCL 50 MG/ML IJ SOLN
INTRAMUSCULAR | Status: AC
  Filled 2019-02-21: qty 1

## 2019-02-21 MED ORDER — ONDANSETRON HCL 4 MG/2ML IJ SOLN
INTRAMUSCULAR | Status: AC
  Filled 2019-02-21: qty 2

## 2019-02-21 NOTE — Discharge Instructions (Signed)
Colonoscopy Discharge Instructions  Read the instructions outlined below and refer to this sheet in the next few weeks. These discharge instructions provide you with general information on caring for yourself after you leave the hospital. Your doctor may also give you specific instructions. While your treatment has been planned according to the most current medical practices available, unavoidable complications occasionally occur. If you have any problems or questions after discharge, call Dr. Gala Romney at [phone]. ACTIVITY  You may resume your regular activity, but move at a slower pace for the next 24 hours.   Take frequent rest periods for the next 24 hours.   Walking will help get rid of the air and reduce the bloated feeling in your belly (abdomen).   No driving for 24 hours (because of the medicine (anesthesia) used during the test).    Do not sign any important legal documents or operate any machinery for 24 hours (because of the anesthesia used during the test).  NUTRITION  Drink plenty of fluids.   You may resume your normal diet as instructed by your doctor.   Begin with a light meal and progress to your normal diet. Heavy or fried foods are harder to digest and may make you feel sick to your stomach (nauseated).   Avoid alcoholic beverages for 24 hours or as instructed.  MEDICATIONS  You may resume your normal medications unless your doctor tells you otherwise.  WHAT YOU CAN EXPECT TODAY  Some feelings of bloating in the abdomen.   Passage of more gas than usual.   Spotting of blood in your stool or on the toilet paper.  IF YOU HAD POLYPS REMOVED DURING THE COLONOSCOPY:  No aspirin products for 7 days or as instructed.   No alcohol for 7 days or as instructed.   Eat a soft diet for the next 24 hours.  FINDING OUT THE RESULTS OF YOUR TEST Not all test results are available during your visit. If your test results are not back during the visit, make an appointment  with your caregiver to find out the results. Do not assume everything is normal if you have not heard from your caregiver or the medical facility. It is important for you to follow up on all of your test results.  SEEK IMMEDIATE MEDICAL ATTENTION IF:  You have more than a spotting of blood in your stool.   Your belly is swollen (abdominal distention).   You are nauseated or vomiting.   You have a temperature over 101.   You have abdominal pain or discomfort that is severe or gets worse throughout the day.   Colon polyp information provided  Further recommendations to follow pending review of pathology report   Colon Polyps  Polyps are tissue growths inside the body. Polyps can grow in many places, including the large intestine (colon). A polyp may be a round bump or a mushroom-shaped growth. You could have one polyp or several. Most colon polyps are noncancerous (benign). However, some colon polyps can become cancerous over time. Finding and removing the polyps early can help prevent this. What are the causes? The exact cause of colon polyps is not known. What increases the risk? You are more likely to develop this condition if you:  Have a family history of colon cancer or colon polyps.  Are older than 20 or older than 45 if you are African American.  Have inflammatory bowel disease, such as ulcerative colitis or Crohn's disease.  Have certain hereditary conditions, such as: ?  Familial adenomatous polyposis. ? Lynch syndrome. ? Turcot syndrome. ? Peutz-Jeghers syndrome.  Are overweight.  Smoke cigarettes.  Do not get enough exercise.  Drink too much alcohol.  Eat a diet that is high in fat and red meat and low in fiber.  Had childhood cancer that was treated with abdominal radiation. What are the signs or symptoms? Most polyps do not cause symptoms. If you have symptoms, they may include:  Blood coming from your rectum when having a bowel movement.  Blood in  your stool. The stool may look dark red or black.  Abdominal pain.  A change in bowel habits, such as constipation or diarrhea. How is this diagnosed? This condition is diagnosed with a colonoscopy. This is a procedure in which a lighted, flexible scope is inserted into the anus and then passed into the colon to examine the area. Polyps are sometimes found when a colonoscopy is done as part of routine cancer screening tests. How is this treated? Treatment for this condition involves removing any polyps that are found. Most polyps can be removed during a colonoscopy. Those polyps will then be tested for cancer. Additional treatment may be needed depending on the results of testing. Follow these instructions at home: Lifestyle  Maintain a healthy weight, or lose weight if recommended by your health care provider.  Exercise every day or as told by your health care provider.  Do not use any products that contain nicotine or tobacco, such as cigarettes and e-cigarettes. If you need help quitting, ask your health care provider.  If you drink alcohol, limit how much you have: ? 0-1 drink a day for women. ? 0-2 drinks a day for men.  Be aware of how much alcohol is in your drink. In the [place] one drink equals one 12 oz bottle of beer (355 mL), one 5 oz glass of wine (148 mL), or one 1 oz shot of hard liquor (44 mL). Eating and drinking   Eat foods that are high in fiber, such as fruits, vegetables, and whole grains.  Eat foods that are high in calcium and vitamin D, such as milk, cheese, yogurt, eggs, liver, fish, and broccoli.  Limit foods that are high in fat, such as fried foods and desserts.  Limit the amount of red meat and processed meat you eat, such as hot dogs, sausage, bacon, and lunch meats. General instructions  Keep all follow-up visits as told by your health care provider. This is important. ? This includes having regularly scheduled colonoscopies. ? Talk to your health  care provider about when you need a colonoscopy. Contact a health care provider if:  You have new or worsening bleeding during a bowel movement.  You have new or increased blood in your stool.  You have a change in bowel habits.  You lose weight for no known reason. Summary  Polyps are tissue growths inside the body. Polyps can grow in many places, including the colon.  Most colon polyps are noncancerous (benign), but some can become cancerous over time.  This condition is diagnosed with a colonoscopy.  Treatment for this condition involves removing any polyps that are found. Most polyps can be removed during a colonoscopy. This information is not intended to replace advice given to you by your health care provider. Make sure you discuss any questions you have with your health care provider. Document Released: 06/09/2004 Document Revised: 12/29/2017 Document Reviewed: 12/29/2017 Elsevier Interactive Patient Education  2019 [employer].

## 2019-02-21 NOTE — H&P (Signed)
@LOGO @   Primary Care Physician:  Redmond School, MD Primary Gastroenterologist:  Dr. Gala Romney  Pre-Procedure History & Physical: HPI:  Duane Porter is a 63 y.o. male is here for a screening colonoscopy.  Colonoscopy 5 years ago.  Positive family history of polyps and colon cancer.  Past Medical History:  Diagnosis Date  . Medical history non-contributory     Past Surgical History:  Procedure Laterality Date  . COLONOSCOPY  2010, 2005  . COLONOSCOPY N/A 10/29/2013   Procedure: COLONOSCOPY;  Surgeon: Daneil Dolin, MD;  Location: [facility];  Service: Endoscopy;  Laterality: N/A;  8:30 AM  . TONSILLECTOMY     at 63 years old    Prior to Admission medications   Not on File    Allergies as of 10/30/2018  . (No Known Allergies)    Family History  Problem Relation Age of Onset  . Colon cancer Other   . Colon cancer Paternal Aunt     Social History   Socioeconomic History  . Marital status: Married    Spouse name: Not on file  . Number of children: Not on file  . Years of education: Not on file  . Highest education level: Not on file  Occupational History  . Not on file  Social Needs  . Financial resource strain: Not on file  . Food insecurity:    Worry: Not on file    Inability: Not on file  . Transportation needs:    Medical: Not on file    Non-medical: Not on file  Tobacco Use  . Smoking status: Never Smoker  . Smokeless tobacco: Never Used  Substance and Sexual Activity  . Alcohol use: Yes    Alcohol/week: 5.0 standard drinks    Types: 5 Shots of liquor per week  . Drug use: No  . Sexual activity: Not on file  Lifestyle  . Physical activity:    Days per week: Not on file    Minutes per session: Not on file  . Stress: Not on file  Relationships  . Social connections:    Talks on phone: Not on file    Gets together: Not on file    Attends religious service: Not on file    Active member of club or organization: Not on file    Attends meetings  of clubs or organizations: Not on file    Relationship status: Not on file  . Intimate partner violence:    Fear of current or ex partner: Not on file    Emotionally abused: Not on file    Physically abused: Not on file    Forced sexual activity: Not on file  Other Topics Concern  . Not on file  Social History Narrative  . Not on file    Review of Systems: See HPI, otherwise negative ROS  Physical Exam: BP (!) 134/93   Pulse 76   Temp 97.7 F (36.5 C) (Oral)   Resp 13   Ht 5\' 11"  (1.803 m)   Wt 81.6 kg   SpO2 100%   BMI 25.10 kg/m  General:   Alert,  Well-developed, well-nourished, pleasant and cooperative in NAD Lungs:  Clear throughout to auscultation.   No wheezes, crackles, or rhonchi. No acute distress. Heart:  Regular rate and rhythm; no murmurs, clicks, rubs,  or gallops. Abdomen:  Soft, nontender and nondistended. No masses, hepatosplenomegaly or hernias noted. Normal bowel sounds, without guarding, and without rebound.     Impression/Plan: Duane Robins  Porter is now here to undergo a screening colonoscopy. Positive family history of colon cancer and polyps.   Risks, benefits, limitations, imponderables and alternatives regarding colonoscopy have been reviewed with the patient. Questions have been answered. All parties agreeable.     Notice:  This dictation was prepared with Dragon dictation along with smaller phrase technology. Any transcriptional errors that result from this process are unintentional and may not be corrected upon review.

## 2019-02-21 NOTE — Op Note (Signed)
[facility] Patient Name: Duane Porter Procedure Date: 02/21/2019 1:20 PM MRN: [id number] Date of Birth: 11/24/1955 Attending MD: Norvel Richards , MD CSN: [id number] Age: 63 Admit Type: Outpatient Procedure:                Colonoscopy Indications:              Colon cancer screening in patient at increased                            risk: Family history of 1st-degree relative with                            colon polyps Providers:                Norvel Richards, MD, Charlsie Quest. Theda Sers RN, RN,                            Raphael Gibney, Technician, Aram Candela Referring MD:              Medicines:                Midazolam 7 mg IV, Meperidine 50 mg IV, Ondansetron                            4 mg IV Complications:            No immediate complications. Estimated Blood Loss:     Estimated blood loss was minimal. Procedure:                Pre-Anesthesia Assessment:                           - Prior to the procedure, a History and Physical                            was performed, and patient medications and                            allergies were reviewed. The patient's tolerance of                            previous anesthesia was also reviewed. The risks                            and benefits of the procedure and the sedation                            options and risks were discussed with the patient.                            All questions were answered, and informed consent                            was obtained. Prior Anticoagulants: The patient has  taken no previous anticoagulant or antiplatelet                            agents. ASA Grade Assessment: II - A patient with                            mild systemic disease. After reviewing the risks                            and benefits, the patient was deemed in                            satisfactory condition to undergo the procedure.                           After obtaining informed  consent, the colonoscope                            was passed under direct vision. Throughout the                            procedure, the patient's blood pressure, pulse, and                            oxygen saturations were monitored continuously. The                            CF-[id number] ([id number]) scope was introduced through                            the anus and advanced to the the cecum, identified                            by appendiceal orifice and ileocecal valve. The                            colonoscopy was performed without difficulty. The                            patient tolerated the procedure well. The quality                            of the bowel preparation was adequate. Scope In: 1:41:06 PM Scope Out: 1:55:40 PM Scope Withdrawal Time: 0 hours 11 minutes 0 seconds  Total Procedure Duration: 0 hours 14 minutes 34 seconds  Findings:      The perianal and digital rectal examinations were normal.      Three sessile polyps were found in the cecum. The polyps were 4 to 6 mm       in size. These polyps were removed with a cold snare. Resection and       retrieval were complete. Estimated blood loss was minimal.      The exam was otherwise without abnormality on direct and retroflexion       views. Impression:               -  Three 4 to 6 mm polyps in the cecum, removed with                            a cold snare. Resected and retrieved.                           - The examination was otherwise normal on direct                            and retroflexion views. Moderate Sedation:      Moderate (conscious) sedation was administered by the endoscopy nurse       and supervised by the endoscopist. The following parameters were       monitored: oxygen saturation, heart rate, blood pressure, respiratory       rate, EKG, adequacy of pulmonary ventilation, and response to care.       Total physician intraservice time was 21 minutes. Recommendation:           - Patient has a  contact number available for                            emergencies. The signs and symptoms of potential                            delayed complications were discussed with the                            patient. Return to normal activities tomorrow.                            Written discharge instructions were provided to the                            patient.                           - Resume previous diet.                           - Continue present medications.                           - Repeat colonoscopy date to be determined after                            pending pathology results are reviewed for                            surveillance.                           - Return to GI office (date not yet determined). At                            patient request, I spoke to wife, Marcoantonio Legault at  [phone] and discussed impression and                            recommendations. Procedure Code(s):        --- Professional ---                           [phone], Colonoscopy, flexible; with removal of                            tumor(s), polyp(s), or other lesion(s) by snare                            technique                           G0500, Moderate sedation services provided by the                            same physician or other qualified health care                            professional performing a gastrointestinal                            endoscopic service that sedation supports,                            requiring the presence of an independent trained                            observer to assist in the monitoring of the                            patient's level of consciousness and physiological                            status; initial 15 minutes of intra-service time;                            patient age 28 years or older (additional time may                            be reported with [phone], as appropriate) Diagnosis Code(s):         --- Professional ---                           Z83.71, Family history of colonic polyps                           K63.5, Polyp of colon CPT copyright 2019 [employer]. All rights reserved. The codes documented in this report are preliminary and upon coder review may  be revised to meet current compliance requirements. Cristopher Estimable. Tekeshia Klahr, MD Norvel Richards, MD 02/21/2019 2:06:45 PM This report has been signed electronically. Number of Addenda: 0

## 2019-02-23 ENCOUNTER — Encounter: Payer: Self-pay | Admitting: Internal Medicine

## 2019-03-01 ENCOUNTER — Encounter (HOSPITAL_COMMUNITY): Payer: Self-pay | Admitting: Internal Medicine

## 2020-02-21 ENCOUNTER — Emergency Department (HOSPITAL_COMMUNITY)
Admission: EM | Admit: 2020-02-21 | Discharge: 2020-02-21 | Disposition: A | Discharge status: Home / Self Care | Payer: BC Managed Care – PPO | Attending: Emergency Medicine | Admitting: Emergency Medicine

## 2020-02-21 ENCOUNTER — Other Ambulatory Visit: Payer: Self-pay

## 2020-02-21 ENCOUNTER — Emergency Department (HOSPITAL_COMMUNITY): Payer: BC Managed Care – PPO

## 2020-02-21 ENCOUNTER — Encounter (HOSPITAL_COMMUNITY): Payer: Self-pay | Admitting: *Deleted

## 2020-02-21 DIAGNOSIS — R509 Fever, unspecified: Secondary | ICD-10-CM | POA: Diagnosis present

## 2020-02-21 DIAGNOSIS — U071 COVID-19: Secondary | ICD-10-CM | POA: Diagnosis not present

## 2020-02-21 DIAGNOSIS — Z8616 Personal history of COVID-19: Secondary | ICD-10-CM

## 2020-02-21 DIAGNOSIS — Z79899 Other long term (current) drug therapy: Secondary | ICD-10-CM | POA: Insufficient documentation

## 2020-02-21 HISTORY — DX: Personal history of COVID-19: Z86.16

## 2020-02-21 LAB — SARS CORONAVIRUS 2 BY RT PCR (HOSPITAL ORDER, PERFORMED IN ~~LOC~~ HOSPITAL LAB): SARS Coronavirus 2: POSITIVE — AB

## 2020-02-21 LAB — CBC
HCT: 46.1 % (ref 39.0–52.0)
Hemoglobin: 15.4 g/dL (ref 13.0–17.0)
MCH: 30.7 pg (ref 26.0–34.0)
MCHC: 33.4 g/dL (ref 30.0–36.0)
MCV: 91.8 fL (ref 80.0–100.0)
Platelets: 115 10*3/uL — ABNORMAL LOW (ref 150–400)
RBC: 5.02 MIL/uL (ref 4.22–5.81)
RDW: 11.3 % — ABNORMAL LOW (ref 11.5–15.5)
WBC: 3.8 10*3/uL — ABNORMAL LOW (ref 4.0–10.5)
nRBC: 0 % (ref 0.0–0.2)

## 2020-02-21 LAB — COMPREHENSIVE METABOLIC PANEL
ALT: 23 U/L (ref 0–44)
AST: 34 U/L (ref 15–41)
Albumin: 3.8 g/dL (ref 3.5–5.0)
Alkaline Phosphatase: 52 U/L (ref 38–126)
Anion gap: 12 (ref 5–15)
BUN: 16 mg/dL (ref 8–23)
CO2: 24 mmol/L (ref 22–32)
Calcium: 8.6 mg/dL — ABNORMAL LOW (ref 8.9–10.3)
Chloride: 99 mmol/L (ref 98–111)
Creatinine, Ser: 1.31 mg/dL — ABNORMAL HIGH (ref 0.61–1.24)
GFR calc Af Amer: >60 mL/min (ref >60)
GFR calc non Af Amer: 58 mL/min — ABNORMAL LOW (ref >60)
Glucose, Bld: 99 mg/dL (ref 70–99)
Potassium: 4.2 mmol/L (ref 3.5–5.1)
Sodium: 135 mmol/L (ref 135–145)
Total Bilirubin: 0.7 mg/dL (ref 0.3–1.2)
Total Protein: 6.8 g/dL (ref 6.5–8.1)

## 2020-02-21 LAB — DIFFERENTIAL
Abs Immature Granulocytes: 0.02 10*3/uL (ref 0.00–0.07)
Basophils Absolute: 0 10*3/uL (ref 0.0–0.1)
Basophils Relative: 0 %
Eosinophils Absolute: 0 10*3/uL (ref 0.0–0.5)
Eosinophils Relative: 0 %
Immature Granulocytes: 1 %
Lymphocytes Relative: 20 %
Lymphs Abs: 0.8 10*3/uL (ref 0.7–4.0)
Monocytes Absolute: 0.2 10*3/uL (ref 0.1–1.0)
Monocytes Relative: 6 %
Neutro Abs: 2.8 10*3/uL (ref 1.7–7.7)
Neutrophils Relative %: 73 %

## 2020-02-21 LAB — LIPASE, BLOOD: Lipase: 27 U/L (ref 11–51)

## 2020-02-21 LAB — MONONUCLEOSIS SCREEN: Mono Screen: NEGATIVE

## 2020-02-21 MED ORDER — SODIUM CHLORIDE 0.9 % IV BOLUS
1000.0000 mL | Freq: Once | INTRAVENOUS | Status: AC
  Administered 2020-02-21: 1000 mL via INTRAVENOUS

## 2020-02-21 NOTE — Discharge Instructions (Addendum)
Drink plenty of fluids.  Take Tylenol or Motrin for fevers and aches.  Call the infusion clinic at [phone] to see if they are going to set you up for the monoclonal antibodies

## 2020-02-21 NOTE — ED Notes (Signed)
Pt aware we need urine sample, urinal at bedside.  

## 2020-02-21 NOTE — ED Provider Notes (Signed)
[facility] Provider Note   CSN: [id number] Arrival date & time: 02/21/20  1440     History Chief Complaint  Patient presents with  . Fever  . Nausea    Duane Porter is a 64 y.o. male.  Patient complains of fevers and aches for 9 days.  He was seen by his primary care doctor and had a negative Covid test but continues to feel bad he did not get his Covid vaccine  The history is provided by the patient. No language interpreter was used.  Fever Temp source:  Subjective Severity:  Mild Onset quality:  Sudden Timing:  Constant Progression:  Worsening Chronicity:  New Relieved by:  Nothing Worsened by:  Nothing Ineffective treatments:  None tried Associated symptoms: no chest pain, no congestion, no cough, no diarrhea, no headaches and no rash   Risk factors: no contaminated food        Past Medical History:  Diagnosis Date  . Medical history non-contributory     There are no problems to display for this patient.   Past Surgical History:  Procedure Laterality Date  . COLONOSCOPY  2010, 2005  . COLONOSCOPY N/A 10/29/2013   Procedure: COLONOSCOPY;  Surgeon: Daneil Dolin, MD;  [facility];  Service: Endoscopy;  Laterality: N/A;  8:30 AM  . COLONOSCOPY N/A 02/21/2019   Procedure: COLONOSCOPY;  Surgeon: Daneil Dolin, MD;  Location: [facility];  Service: Endoscopy;  Laterality: N/A;  12:00  . POLYPECTOMY  02/21/2019   Procedure: POLYPECTOMY;  Surgeon: Daneil Dolin, MD;  Location: [facility];  Service: Endoscopy;;  . TONSILLECTOMY     at 64 years old       Family History  Problem Relation Age of Onset  . Colon cancer Other   . Colon cancer Paternal Aunt     Social History   Tobacco Use  . Smoking status: Never Smoker  . Smokeless tobacco: Never Used  Substance Use Topics  . Alcohol use: Yes    Alcohol/week: 5.0 standard drinks    Types: 5 Shots of liquor per week  . Drug use: No    Home Medications Prior to  Admission medications   Medication Sig Start Date End Date Taking? Authorizing Provider  doxycycline (VIBRA-TABS) 100 MG tablet Take 100 mg by mouth 2 (two) times daily. 02/19/20  Yes [provider]    Allergies    Patient has no known allergies.  Review of Systems   Review of Systems  Constitutional: Positive for fever. Negative for appetite change and fatigue.  HENT: Negative for congestion, ear discharge and sinus pressure.   Eyes: Negative for discharge.  Respiratory: Negative for cough.   Cardiovascular: Negative for chest pain.  Gastrointestinal: Negative for abdominal pain and diarrhea.  Genitourinary: Negative for frequency and hematuria.  Musculoskeletal: Negative for back pain.  Skin: Negative for rash.  Neurological: Negative for seizures and headaches.  Psychiatric/Behavioral: Negative for hallucinations.    Physical Exam Updated Vital Signs BP 114/74   Pulse 89   Temp 99.2 F (37.3 C) (Oral)   Resp 18   Ht 5\' 11"  (1.803 m)   Wt 81.6 kg   SpO2 97%   BMI 25.10 kg/m   Physical Exam Vitals and nursing note reviewed.  Constitutional:      Appearance: He is well-developed.  HENT:     Head: Normocephalic.     Nose: Nose normal.  Eyes:     General: No scleral  icterus.    Conjunctiva/sclera: Conjunctivae normal.  Neck:     Thyroid: No thyromegaly.  Cardiovascular:     Rate and Rhythm: Normal rate and regular rhythm.     Heart sounds: No murmur. No friction rub. No gallop.   Pulmonary:     Breath sounds: No stridor. No wheezing or rales.  Chest:     Chest wall: No tenderness.  Abdominal:     General: There is no distension.     Tenderness: There is no abdominal tenderness. There is no rebound.  Musculoskeletal:        General: Normal range of motion.     Cervical back: Neck supple.  Lymphadenopathy:     Cervical: No cervical adenopathy.  Skin:    Findings: No erythema or rash.  Neurological:     Mental Status: He is alert and oriented to  person, place, and time.     Motor: No abnormal muscle tone.     Coordination: Coordination normal.  Psychiatric:        Behavior: Behavior normal.     ED Results / Procedures / Treatments   Labs (all labs ordered are listed, but only abnormal results are displayed) Labs Reviewed  SARS CORONAVIRUS 2 BY RT PCR (HOSPITAL ORDER, [facility]) - Abnormal; Notable for the following components:      Result Value   SARS Coronavirus 2 POSITIVE (*)    All other components within normal limits  COMPREHENSIVE METABOLIC PANEL - Abnormal; Notable for the following components:   Creatinine, Ser 1.31 (*)    Calcium 8.6 (*)    GFR calc non Af Amer 58 (*)    All other components within normal limits  CBC - Abnormal; Notable for the following components:   WBC 3.8 (*)    RDW 11.3 (*)    Platelets 115 (*)    All other components within normal limits  LIPASE, BLOOD  DIFFERENTIAL  MONONUCLEOSIS SCREEN  URINALYSIS, ROUTINE W REFLEX MICROSCOPIC    EKG None  Radiology DG Chest Port 1 View  Result Date: 02/21/2020 CLINICAL DATA:  Fever, weakness. EXAM: PORTABLE CHEST 1 VIEW COMPARISON:  None. FINDINGS: The heart size and mediastinal contours are within normal limits. Both lungs are clear. The visualized skeletal structures are unremarkable. IMPRESSION: No active disease. Electronically Signed   By: Marijo Conception M.D.   On: 02/21/2020 19:06    Procedures Procedures (including critical care time)  Medications Ordered in ED Medications  sodium chloride 0.9 % bolus 1,000 mL (0 mLs Intravenous Stopped 02/21/20 1945)    ED Course  I have reviewed the triage vital signs and the nursing notes.  Pertinent labs & imaging results that were available during my care of the patient were reviewed by me and considered in my medical decision making (see chart for details).    MDM Rules/Calculators/A&P                      Patient Is positive for Covid.  He does not need  hospitalization.  He does qualify for monoclonal antibody treatment because his BMI is greater than 25.  I have put the order in for outpatient treatment and given the patient the phone number for the infusion center      This patient presents to the ED for concern of fever aches this involves an extensive number of treatment options, and is a complaint that carries with it a high risk of complications  and morbidity.  The differential diagnosis includes  COVID-19 viral syndrome         Lab Tests:   I Ordered, reviewed, and interpreted labs, which included CBC and chemistries which were negative.  Covid test positive  Medicines ordered:     Imaging Studies ordered:   I ordered imaging studies which included chest x-ray and I independently visualized and interpreted imaging which showed unremarkable Additional history obtained:   Additional history obtained from significant other  Previous records obtained and reviewed   Consultations Obtained:   Reevaluation:  After the interventions stated above, I reevaluated the patient and found no difference  Critical Interventions:  .   Final Clinical Impression(s) / ED Diagnoses Final diagnoses:  COVID-19    Rx / DC Orders ED Discharge Orders    None       Milton Ferguson, MD 02/21/20 2053

## 2020-02-21 NOTE — ED Triage Notes (Signed)
C/o fever and weakness for the past 9 days, states he tested negative for covid, lyme disease and RMSF

## 2020-02-21 NOTE — ED Notes (Signed)
Date and time results received: 02/21/20 7:44 PM  Test: Covid  Critical Value: +  Name of Provider Notified: Dr. Roderic Palau  Orders Received? Or Actions Taken?: Orders Received - See Orders for details and Actions Taken: n/a

## 2020-02-22 ENCOUNTER — Telehealth: Payer: Self-pay | Admitting: Unknown Physician Specialty

## 2020-02-22 NOTE — Telephone Encounter (Signed)
Called to discuss with patient about Covid symptoms and the use of bamlanivimab, a monoclonal antibody infusion for those with mild to moderate Covid symptoms and at a high risk of hospitalization.  Pt is qualified for this infusion at the [facility] infusion center due to BMI >25  Message left to call back

## 2020-02-22 NOTE — Telephone Encounter (Signed)
Called to discuss with Karlyn Agee about Covid symptoms and the use of bamlanivimab, a monoclonal antibody infusion for those with mild to moderate Covid symptoms and at a high risk of hospitalization.     Pt is qualified for this infusion at the [facility] infusion center due to co-morbid conditions and/or a member of an at-risk group, however declines infusion at this time. Symptoms tier reviewed as well as criteria for ending isolation.  Symptoms reviewed that would warrant ED/Hospital evaluation. Preventative practices reviewed. Patient verbalized understanding. Patient advised to call back if he decides that he does want to get infusion. Callback number to the infusion center given. Patient advised to go to Urgent care or ED with severe symptoms. Last date he would be eligible for infusion is today

## 2020-12-24 ENCOUNTER — Other Ambulatory Visit: Payer: Self-pay | Admitting: Family Medicine

## 2020-12-24 DIAGNOSIS — L0231 Cutaneous abscess of buttock: Secondary | ICD-10-CM

## 2021-01-08 ENCOUNTER — Ambulatory Visit: Payer: BC Managed Care – PPO | Admitting: General Surgery

## 2021-01-27 ENCOUNTER — Ambulatory Visit: Payer: Self-pay | Admitting: Surgery

## 2021-01-27 NOTE — H&P (View-Only) (Signed)
  Recurrent perianal pain swelling with abscess and now recurrent drainage highly suspicious for fistula.  I think he would benefit from examination under anesthesia.  Probable layered LIFT repair versus fistulotomy.  This will allow me to do hemorrhoidal ligation and pexy and possible hemorrhoidectomy's as well.  I had a long discussion about the technique.  He and especially his wife wanted hyper detailed descriptions of the technique procedure.  I noted about a 80-85% heel rate.  Failures usually happen in smokers or people with bad diarrhea or inflammatory bowel disease.  I encouraged him to quit smoking.  He is frustrated and wishes to do something.  He is interested in proceeding with surgery.  We will try and coordinate convenient time.     [name], MD, FACS, MASCRS  Esophageal, Gastrointestinal & Colorectal Surgery Robotic and Minimally Invasive Surgery [facility] [address] [place], [place] [address] [phone] Fax [phone] Main/Paging  CONTACT INFORMATION: Weekday (9AM-5PM) concerns: Call [facility] main office at [phone] Weeknight (5PM-9AM) or Weekend/Holiday concerns: Check [email or web address] for General Surgery [facility] coverage (Please, do not use SecureChat as it is not reliable communication to operating surgeons for immediate patient care)

## 2021-01-27 NOTE — H&P (Signed)
  Recurrent perianal pain swelling with abscess and now recurrent drainage highly suspicious for fistula.  I think he would benefit from examination under anesthesia.  Probable layered LIFT repair versus fistulotomy.  This will allow me to do hemorrhoidal ligation and pexy and possible hemorrhoidectomy's as well.  I had a long discussion about the technique.  He and especially his wife wanted hyper detailed descriptions of the technique procedure.  I noted about a 80-85% heel rate.  Failures usually happen in smokers or people with bad diarrhea or inflammatory bowel disease.  I encouraged him to quit smoking.  He is frustrated and wishes to do something.  He is interested in proceeding with surgery.  We will try and coordinate convenient time.     Malichi Palardy C. Tyrika Newman, MD, FACS, MASCRS  Esophageal, Gastrointestinal & Colorectal Surgery Robotic and Minimally Invasive Surgery [facility] [address] [place], [place] [address] [phone] Fax [phone] Main/Paging  CONTACT INFORMATION: Weekday (9AM-5PM) concerns: Call [facility] main office at [phone] Weeknight (5PM-9AM) or Weekend/Holiday concerns: Check [email or web address] for General Surgery [facility] coverage (Please, do not use SecureChat as it is not reliable communication to operating surgeons for immediate patient care)      

## 2021-01-28 ENCOUNTER — Encounter (HOSPITAL_BASED_OUTPATIENT_CLINIC_OR_DEPARTMENT_OTHER): Payer: Self-pay | Admitting: Surgery

## 2021-01-28 ENCOUNTER — Other Ambulatory Visit (HOSPITAL_COMMUNITY)
Admission: RE | Admit: 2021-01-28 | Discharge: 2021-01-28 | Disposition: A | Discharge status: Home / Self Care | Payer: BC Managed Care – PPO | Source: Ambulatory Visit | Attending: Surgery | Admitting: Surgery

## 2021-01-28 ENCOUNTER — Other Ambulatory Visit: Payer: Self-pay

## 2021-01-28 DIAGNOSIS — Z86010 Personal history of colonic polyps: Secondary | ICD-10-CM | POA: Diagnosis not present

## 2021-01-28 DIAGNOSIS — K605 Anorectal fistula: Secondary | ICD-10-CM | POA: Diagnosis present

## 2021-01-28 DIAGNOSIS — Z20822 Contact with and (suspected) exposure to covid-19: Secondary | ICD-10-CM | POA: Diagnosis not present

## 2021-01-28 DIAGNOSIS — Z01812 Encounter for preprocedural laboratory examination: Secondary | ICD-10-CM | POA: Insufficient documentation

## 2021-01-28 DIAGNOSIS — Z87891 Personal history of nicotine dependence: Secondary | ICD-10-CM | POA: Diagnosis not present

## 2021-01-28 DIAGNOSIS — Z8616 Personal history of COVID-19: Secondary | ICD-10-CM | POA: Diagnosis not present

## 2021-01-28 DIAGNOSIS — Z800 Family history of malignant neoplasm of digestive organs: Secondary | ICD-10-CM | POA: Diagnosis not present

## 2021-01-28 LAB — SARS CORONAVIRUS 2 (TAT 6-24 HRS): SARS Coronavirus 2: NEGATIVE

## 2021-01-28 NOTE — Progress Notes (Signed)
Spoke w/ via phone for pre-op interview--- PT Lab needs dos----  no             Lab results------ no COVID test ------ done 01-28-2021 result in epic Arrive at ------- 1230 on 01-29-2021 NPO after MN NO Solid Food.  Clear liquids from MN until---  1130 Med rec completed Medications to take morning of surgery ----- NONE Diabetic medication ----- n/a Patient instructed to bring photo id and insurance card day of surgery Patient aware to have Driver (ride ) / caregiver    for 24 hours after surgery -- wife, Arbie Cookey Patient Special Instructions ----- n/a Pre-Op special Istructions ----- n/a Patient verbalized understanding of instructions that were given at this phone interview. Patient denies shortness of breath, chest pain, fever, cough at this phone interview.

## 2021-01-29 ENCOUNTER — Encounter (HOSPITAL_BASED_OUTPATIENT_CLINIC_OR_DEPARTMENT_OTHER): Payer: Self-pay | Admitting: Surgery

## 2021-01-29 ENCOUNTER — Encounter (HOSPITAL_BASED_OUTPATIENT_CLINIC_OR_DEPARTMENT_OTHER)
Admission: RE | Disposition: A | Discharge status: Home / Self Care | Payer: Self-pay | Source: Home / Self Care | Attending: Surgery

## 2021-01-29 ENCOUNTER — Ambulatory Visit (HOSPITAL_BASED_OUTPATIENT_CLINIC_OR_DEPARTMENT_OTHER)
Admission: RE | Admit: 2021-01-29 | Discharge: 2021-01-29 | Disposition: A | Discharge status: Home / Self Care | Payer: BC Managed Care – PPO | Attending: Surgery | Admitting: Surgery

## 2021-01-29 ENCOUNTER — Ambulatory Visit (HOSPITAL_BASED_OUTPATIENT_CLINIC_OR_DEPARTMENT_OTHER): Payer: BC Managed Care – PPO | Admitting: Anesthesiology

## 2021-01-29 ENCOUNTER — Other Ambulatory Visit: Payer: Self-pay

## 2021-01-29 DIAGNOSIS — Z86010 Personal history of colonic polyps: Secondary | ICD-10-CM | POA: Insufficient documentation

## 2021-01-29 DIAGNOSIS — Z800 Family history of malignant neoplasm of digestive organs: Secondary | ICD-10-CM | POA: Insufficient documentation

## 2021-01-29 DIAGNOSIS — Z8616 Personal history of COVID-19: Secondary | ICD-10-CM | POA: Insufficient documentation

## 2021-01-29 DIAGNOSIS — Z20822 Contact with and (suspected) exposure to covid-19: Secondary | ICD-10-CM | POA: Insufficient documentation

## 2021-01-29 DIAGNOSIS — Z87891 Personal history of nicotine dependence: Secondary | ICD-10-CM | POA: Insufficient documentation

## 2021-01-29 DIAGNOSIS — K605 Anorectal fistula: Secondary | ICD-10-CM | POA: Insufficient documentation

## 2021-01-29 HISTORY — PX: RECTAL EXAM UNDER ANESTHESIA: SHX6399

## 2021-01-29 HISTORY — DX: Presence of spectacles and contact lenses: Z97.3

## 2021-01-29 HISTORY — DX: Personal history of adenomatous and serrated colon polyps: Z86.0101

## 2021-01-29 HISTORY — DX: Rectal fistula, unspecified: K60.40

## 2021-01-29 HISTORY — PX: EVALUATION UNDER ANESTHESIA WITH ANAL FISTULECTOMY: SHX5621

## 2021-01-29 HISTORY — DX: Rectal fistula: K60.4

## 2021-01-29 HISTORY — DX: Personal history of colonic polyps: Z86.010

## 2021-01-29 SURGERY — EXAM UNDER ANESTHESIA WITH ANAL FISTULECTOMY
Anesthesia: General | Site: Rectum

## 2021-01-29 MED ORDER — PROPOFOL 10 MG/ML IV BOLUS
INTRAVENOUS | Status: DC | PRN
  Administered 2021-01-29: 150 mg via INTRAVENOUS
  Administered 2021-01-29: 50 mg via INTRAVENOUS

## 2021-01-29 MED ORDER — OXYCODONE HCL 5 MG/5ML PO SOLN: 5.0000 mg | Freq: Once | ORAL | Status: DC | PRN

## 2021-01-29 MED ORDER — 0.9 % SODIUM CHLORIDE (POUR BTL) OPTIME
TOPICAL | Status: DC | PRN
  Administered 2021-01-29: 500 mL

## 2021-01-29 MED ORDER — LIDOCAINE 2% (20 MG/ML) 5 ML SYRINGE
INTRAMUSCULAR | Status: AC
  Filled 2021-01-29: qty 5

## 2021-01-29 MED ORDER — FENTANYL CITRATE (PF) 100 MCG/2ML IJ SOLN
INTRAMUSCULAR | Status: AC
  Filled 2021-01-29: qty 2

## 2021-01-29 MED ORDER — BUPIVACAINE-EPINEPHRINE 0.25% -1:200000 IJ SOLN
INTRAMUSCULAR | Status: DC | PRN
  Administered 2021-01-29: 20 mL

## 2021-01-29 MED ORDER — DIAZEPAM 5 MG PO TABS: 5.0000 mg | ORAL_TABLET | Freq: Four times a day (QID) | ORAL | Status: DC | PRN

## 2021-01-29 MED ORDER — DIBUCAINE 1 % EX OINT
TOPICAL_OINTMENT | CUTANEOUS | Status: DC | PRN
  Administered 2021-01-29: 1

## 2021-01-29 MED ORDER — ACETAMINOPHEN 500 MG PO TABS
ORAL_TABLET | ORAL | Status: AC
  Filled 2021-01-29: qty 2

## 2021-01-29 MED ORDER — ROCURONIUM BROMIDE 100 MG/10ML IV SOLN
INTRAVENOUS | Status: DC | PRN
  Administered 2021-01-29: 60 mg via INTRAVENOUS

## 2021-01-29 MED ORDER — CELECOXIB 200 MG PO CAPS
ORAL_CAPSULE | ORAL | Status: AC
  Filled 2021-01-29: qty 1

## 2021-01-29 MED ORDER — ONDANSETRON HCL 4 MG/2ML IJ SOLN
INTRAMUSCULAR | Status: AC
  Filled 2021-01-29: qty 2

## 2021-01-29 MED ORDER — ONDANSETRON HCL 4 MG/2ML IJ SOLN
INTRAMUSCULAR | Status: DC | PRN
  Administered 2021-01-29: 4 mg via INTRAVENOUS

## 2021-01-29 MED ORDER — OXYCODONE HCL 5 MG PO TABS: 5.0000 mg | ORAL_TABLET | Freq: Once | ORAL | Status: DC | PRN

## 2021-01-29 MED ORDER — PROPOFOL 10 MG/ML IV BOLUS
INTRAVENOUS | Status: AC
  Filled 2021-01-29: qty 20

## 2021-01-29 MED ORDER — SODIUM CHLORIDE 0.9 % IV SOLN: INTRAVENOUS | Status: DC

## 2021-01-29 MED ORDER — DEXAMETHASONE SODIUM PHOSPHATE 10 MG/ML IJ SOLN
INTRAMUSCULAR | Status: AC
  Filled 2021-01-29: qty 1

## 2021-01-29 MED ORDER — CEFTRIAXONE SODIUM 2 G IJ SOLR
INTRAMUSCULAR | Status: AC
  Filled 2021-01-29: qty 20

## 2021-01-29 MED ORDER — MIDAZOLAM HCL 5 MG/5ML IJ SOLN
INTRAMUSCULAR | Status: DC | PRN
  Administered 2021-01-29: 2 mg via INTRAVENOUS

## 2021-01-29 MED ORDER — MIDAZOLAM HCL 2 MG/2ML IJ SOLN
INTRAMUSCULAR | Status: AC
  Filled 2021-01-29: qty 2

## 2021-01-29 MED ORDER — LIDOCAINE HCL (CARDIAC) PF 100 MG/5ML IV SOSY
PREFILLED_SYRINGE | INTRAVENOUS | Status: DC | PRN
  Administered 2021-01-29: 100 mg via INTRAVENOUS

## 2021-01-29 MED ORDER — ACETAMINOPHEN 500 MG PO TABS: 1000.0000 mg | ORAL_TABLET | ORAL | Status: DC

## 2021-01-29 MED ORDER — GABAPENTIN 300 MG PO CAPS
ORAL_CAPSULE | ORAL | Status: AC
  Filled 2021-01-29: qty 1

## 2021-01-29 MED ORDER — CELECOXIB 200 MG PO CAPS: 200.0000 mg | ORAL_CAPSULE | ORAL | Status: DC

## 2021-01-29 MED ORDER — DIAZEPAM 5 MG PO TABS: 5.0000 mg | ORAL_TABLET | Freq: Four times a day (QID) | ORAL | 2 refills | Status: DC | PRN

## 2021-01-29 MED ORDER — LACTATED RINGERS IV SOLN: INTRAVENOUS | Status: DC | PRN

## 2021-01-29 MED ORDER — ACETAMINOPHEN 500 MG PO TABS
1000.0000 mg | ORAL_TABLET | Freq: Once | ORAL | Status: AC
  Administered 2021-01-29: 1000 mg via ORAL

## 2021-01-29 MED ORDER — METRONIDAZOLE 500 MG/100ML IV SOLN
INTRAVENOUS | Status: AC
  Filled 2021-01-29: qty 100

## 2021-01-29 MED ORDER — PROMETHAZINE HCL 25 MG/ML IJ SOLN: 6.2500 mg | INTRAMUSCULAR | Status: DC | PRN

## 2021-01-29 MED ORDER — METHYLENE BLUE 0.5 % INJ SOLN
INTRAVENOUS | Status: DC | PRN
  Administered 2021-01-29: 5 mL via SUBMUCOSAL

## 2021-01-29 MED ORDER — BUPIVACAINE LIPOSOME 1.3 % IJ SUSP: 20.0000 mL | Freq: Once | INTRAMUSCULAR | Status: DC

## 2021-01-29 MED ORDER — BUPIVACAINE LIPOSOME 1.3 % IJ SUSP
INTRAMUSCULAR | Status: DC | PRN
  Administered 2021-01-29: 20 mL

## 2021-01-29 MED ORDER — FENTANYL CITRATE (PF) 100 MCG/2ML IJ SOLN
INTRAMUSCULAR | Status: DC | PRN
  Administered 2021-01-29 (×2): 50 ug via INTRAVENOUS

## 2021-01-29 MED ORDER — SODIUM CHLORIDE 0.9 % IV SOLN
INTRAVENOUS | Status: AC
  Filled 2021-01-29: qty 100

## 2021-01-29 MED ORDER — METRONIDAZOLE 500 MG/100ML IV SOLN
500.0000 mg | INTRAVENOUS | Status: AC
  Administered 2021-01-29: 500 mg via INTRAVENOUS

## 2021-01-29 MED ORDER — ROCURONIUM BROMIDE 10 MG/ML (PF) SYRINGE
PREFILLED_SYRINGE | INTRAVENOUS | Status: AC
  Filled 2021-01-29: qty 10

## 2021-01-29 MED ORDER — CHLORHEXIDINE GLUCONATE CLOTH 2 % EX PADS: 6.0000 | MEDICATED_PAD | Freq: Once | CUTANEOUS | Status: DC

## 2021-01-29 MED ORDER — SODIUM CHLORIDE 0.9 % IV SOLN
2.0000 g | INTRAVENOUS | Status: AC
  Administered 2021-01-29: 2 g via INTRAVENOUS

## 2021-01-29 MED ORDER — OXYCODONE HCL 5 MG PO TABS: 5.0000 mg | ORAL_TABLET | Freq: Four times a day (QID) | ORAL | 0 refills | Status: DC | PRN

## 2021-01-29 MED ORDER — SUGAMMADEX SODIUM 200 MG/2ML IV SOLN
INTRAVENOUS | Status: DC | PRN
  Administered 2021-01-29: 200 mg via INTRAVENOUS

## 2021-01-29 MED ORDER — FENTANYL CITRATE (PF) 100 MCG/2ML IJ SOLN: 25.0000 ug | INTRAMUSCULAR | Status: DC | PRN

## 2021-01-29 MED ORDER — GABAPENTIN 300 MG PO CAPS: 300.0000 mg | ORAL_CAPSULE | ORAL | Status: DC

## 2021-01-29 MED ORDER — DEXAMETHASONE SODIUM PHOSPHATE 4 MG/ML IJ SOLN
INTRAMUSCULAR | Status: DC | PRN
  Administered 2021-01-29: 10 mg via INTRAVENOUS

## 2021-01-29 MED ORDER — ENSURE PRE-SURGERY PO LIQD
296.0000 mL | Freq: Once | ORAL | Status: DC
Start: 2021-01-30 — End: 2021-01-29

## 2021-01-29 SURGICAL SUPPLY — 66 items
APL SKNCLS STERI-STRIP NONHPOA (GAUZE/BANDAGES/DRESSINGS) ×2
BENZOIN TINCTURE PRP APPL 2/3 (GAUZE/BANDAGES/DRESSINGS) ×3 IMPLANT
BLADE CLIPPER SENSICLIP SURGIC (BLADE) IMPLANT
BLADE HEX COATED 2.75 (ELECTRODE) ×3 IMPLANT
BLADE SURG 10 STRL SS (BLADE) IMPLANT
BLADE SURG 15 STRL LF DISP TIS (BLADE) ×2 IMPLANT
BLADE SURG 15 STRL SS (BLADE) ×3
BRIEF STRETCH FOR OB PAD LRG (UNDERPADS AND DIAPERS) ×3 IMPLANT
CANISTER SUCT 1200ML W/VALVE (MISCELLANEOUS) ×3 IMPLANT
COVER BACK TABLE 60X90IN (DRAPES) ×3 IMPLANT
COVER MAYO STAND STRL (DRAPES) ×3 IMPLANT
COVER WAND RF STERILE (DRAPES) ×3 IMPLANT
DECANTER SPIKE VIAL GLASS SM (MISCELLANEOUS) ×3 IMPLANT
DRAPE HYSTEROSCOPY (MISCELLANEOUS) ×3 IMPLANT
DRAPE LAPAROTOMY 100X72 PEDS (DRAPES) ×3 IMPLANT
DRAPE SHEET LG 3/4 BI-LAMINATE (DRAPES) IMPLANT
DRSG PAD ABDOMINAL 8X10 ST (GAUZE/BANDAGES/DRESSINGS) ×3 IMPLANT
ELECT NEEDLE TIP 2.8 STRL (NEEDLE) IMPLANT
ELECT REM PT RETURN 9FT ADLT (ELECTROSURGICAL) ×3
ELECTRODE REM PT RTRN 9FT ADLT (ELECTROSURGICAL) ×2 IMPLANT
FILTER STRAW (MISCELLANEOUS) ×3 IMPLANT
GAUZE SPONGE 4X4 12PLY STRL (GAUZE/BANDAGES/DRESSINGS) IMPLANT
GAUZE SPONGE 4X4 12PLY STRL LF (GAUZE/BANDAGES/DRESSINGS) ×3 IMPLANT
GLOVE SURG LTX SZ8 (GLOVE) ×3 IMPLANT
GLOVE SURG UNDER LTX SZ8 (GLOVE) ×3 IMPLANT
GOWN STRL REUS W/TWL XL LVL3 (GOWN DISPOSABLE) ×3 IMPLANT
IV CATH 14GX2 1/4 (CATHETERS) IMPLANT
IV CATH 18G SAFETY (IV SOLUTION) ×3 IMPLANT
IV CATH PLACEMENT 20 GA (IV SOLUTION) ×3 IMPLANT
KIT SIGMOIDOSCOPE (SET/KITS/TRAYS/PACK) IMPLANT
KIT TURNOVER CYSTO (KITS) ×3 IMPLANT
LEGGING LITHOTOMY PAIR STRL (DRAPES) IMPLANT
LOOP VESSEL MAXI BLUE (MISCELLANEOUS) IMPLANT
NEEDLE HYPO 22GX1.5 SAFETY (NEEDLE) ×3 IMPLANT
NS IRRIG 500ML POUR BTL (IV SOLUTION) ×3 IMPLANT
PACK BASIN DAY SURGERY FS (CUSTOM PROCEDURE TRAY) ×3 IMPLANT
PAD PREP 24X48 CUFFED NSTRL (MISCELLANEOUS) ×3 IMPLANT
PENCIL SMOKE EVACUATOR (MISCELLANEOUS) ×3 IMPLANT
SCRUB TECHNI CARE 4 OZ NO DYE (MISCELLANEOUS) ×3 IMPLANT
SHEARS HARMONIC 9CM CVD (BLADE) IMPLANT
SURGILUBE 2OZ TUBE FLIPTOP (MISCELLANEOUS) ×3 IMPLANT
SUT CHROMIC 2 0 SH (SUTURE) ×6 IMPLANT
SUT CHROMIC 3 0 SH 27 (SUTURE) ×3 IMPLANT
SUT ETHIBOND 0 (SUTURE) IMPLANT
SUT MNCRL AB 4-0 PS2 18 (SUTURE) IMPLANT
SUT PROLENE 2 0 SH DA (SUTURE) IMPLANT
SUT VIC AB 2-0 SH 27 (SUTURE)
SUT VIC AB 2-0 SH 27XBRD (SUTURE) IMPLANT
SUT VIC AB 2-0 UR6 27 (SUTURE) ×3 IMPLANT
SUT VIC AB 3-0 SH 18 (SUTURE) IMPLANT
SUT VICRYL 0 UR6 27IN ABS (SUTURE) IMPLANT
SUT VICRYL AB 2 0 TIE (SUTURE) IMPLANT
SUT VICRYL AB 2 0 TIES (SUTURE)
SWAB COLLECTION DEVICE MRSA (MISCELLANEOUS) IMPLANT
SWAB CULTURE ESWAB REG 1ML (MISCELLANEOUS) IMPLANT
SYR 20ML LL LF (SYRINGE) ×9 IMPLANT
SYR 27GX1/2 1ML LL SAFETY (SYRINGE) ×3 IMPLANT
SYR BULB IRRIG 60ML STRL (SYRINGE) ×3 IMPLANT
SYR CONTROL 10ML LL (SYRINGE) ×3 IMPLANT
TAPE CLOTH 3X10 TAN LF (GAUZE/BANDAGES/DRESSINGS) ×3 IMPLANT
TOWEL OR 17X26 10 PK STRL BLUE (TOWEL DISPOSABLE) ×6 IMPLANT
TRAY DSU PREP LF (CUSTOM PROCEDURE TRAY) ×3 IMPLANT
TUBE CONNECTING 12X1/4 (SUCTIONS) ×3 IMPLANT
UNDERPAD 30X36 HEAVY ABSORB (UNDERPADS AND DIAPERS) ×3 IMPLANT
WATER STERILE IRR 500ML POUR (IV SOLUTION) ×3 IMPLANT
YANKAUER SUCT BULB TIP NO VENT (SUCTIONS) ×6 IMPLANT

## 2021-01-29 NOTE — Interval H&P Note (Signed)
History and Physical Interval Note:  01/29/2021 1:14 PM  Duane Porter Appointment: 01/27/2021 11:00 AM Location: [facility] Patient #: [id number] DOB: 11-27-1955 Married / Language: [name] / Race: White Male   History of Present Illness [name] MD; 01/27/2021 5:42 PM) The patient is a 65 year old male who presents with anal fistula. Note for "Anal fistula": ` ` ` Patient sent for surgical consultation at the request of  Chief Complaint: recurrent abscess ` ` Patient with history of perirectal pain and swelling.  Presumed to be abscess.  Has had numerous rounds of antibiotics that usually get it under control.  However he had a more intense episode 2 months ago that required urgent incision and drainage.  It seemed to close down but then he felt pain and tingling and spontaneous drainage of pus last weekend.  Frustrating.  Parents have a history of polyps.  There was concern of Lynch syndrome but he tested negative.  He gets colonoscopies every 5 years.  Last 2020 was underwhelming with a few small adenomatous polyps.  Patient usually has his bowel movements once a day.  He does smoke small cigars.  Maybe goes through a pack of them a month.  Intermittent.  No diabetes or sleep apnea.  Walks 45 minutes a day without difficulty.  No cardiac or vascular issues.  He is frustrated by the recurrent infections and is leaning towards doing something.  His wife is here with him today.  They have looked online a lot and are worried about interventions and surgeries.  (Review of systems as stated in this history (HPI) or in the review of systems.  Otherwise all other 12 point ROS are negative) ` ` ###########################################`  This patient encounter took 37 minutes today to perform the following: obtain history, perform exam, review outside records, interpret tests & imaging, counsel the patient on their diagnosis; and, document this encounter, including findings &  plan in the electronic health record (EHR).   Allergies Janeann Forehand, CNA; 01/27/2021 11:16 AM) No Known Drug Allergies   [12/15/2020]: Allergies Reconciled    Medication History Janeann Forehand, CNA; 01/27/2021 11:16 AM) No Current Medications  Medications Reconciled     Physical Exam [name] MD; 01/27/2021 11:46 AM) General Mental Status - Alert. General Appearance - Not in acute distress, Not Sickly. Orientation - Oriented X3. Hydration - Well hydrated. Voice - Normal.  Integumentary Global Assessment Upon inspection and palpation of skin surfaces of the - Axillae: non-tender, no inflammation or ulceration, no drainage. and Distribution of scalp and body hair is normal. General Characteristics Temperature - normal warmth is noted.  Head and Neck Head - normocephalic, atraumatic with no lesions or palpable masses. Face Global Assessment - atraumatic, no absence of expression. Neck Global Assessment - no abnormal movements, no bruit auscultated on the right, no bruit auscultated on the left, no decreased range of motion, non-tender. Trachea - midline. Thyroid Gland Characteristics - non-tender.  Eye Eyeball - Left - Extraocular movements intact, No Nystagmus - Left. Eyeball - Right - Extraocular movements intact, No Nystagmus - Right. Cornea - Left - No Hazy - Left. Cornea - Right - No Hazy - Right. Sclera/Conjunctiva - Left - No scleral icterus, No Discharge - Left. Sclera/Conjunctiva - Right - No scleral icterus, No Discharge - Right. Pupil - Left - Direct reaction to light normal. Pupil - Right - Direct reaction to light normal.  ENMT Ears Pinna - Left - no drainage observed, no generalized  tenderness observed. Pinna - Right - no drainage observed, no generalized tenderness observed. Nose and Sinuses External Inspection of the Nose - no destructive lesion observed. Inspection of the nares - Left - quiet respiration. Inspection of the nares - Right -  quiet respiration. Mouth and Throat Lips - Upper Lip - no fissures observed, no pallor noted. Lower Lip - no fissures observed, no pallor noted. Nasopharynx - no discharge present. Oral Cavity/Oropharynx - Tongue - no dryness observed. Oral Mucosa - no cyanosis observed. Hypopharynx - no evidence of airway distress observed.  Chest and Lung Exam Inspection Movements - Normal and Symmetrical. Accessory muscles - No use of accessory muscles in breathing. Palpation Palpation of the chest reveals - Non-tender. Auscultation Breath sounds - Normal and Clear.  Cardiovascular Auscultation Rhythm - Regular. Murmurs & Other Heart Sounds - Auscultation of the heart reveals - No Murmurs and No Systolic Clicks.  Abdomen Inspection Inspection of the abdomen reveals - No Visible peristalsis and No Abnormal pulsations. Umbilicus - No Bleeding, No Urine drainage. Palpation/Percussion Palpation and Percussion of the abdomen reveal - Soft, Non Tender, No Rebound tenderness, No Rigidity (guarding) and No Cutaneous hyperesthesia. Note:  Abdomen soft.  Nontender.  Not distended.  No umbilical or incisional hernias.  No guarding.   Male Genitourinary Sexual Maturity Tanner 5 - Adult hair pattern and Adult penile size and shape. Note:  No inguinal hernias. Normal external genitalia. Epididymi, testes, and spermatic cords normal without any masses.   Rectal Note:  #########################  Please see anoscopy.  Right anterior sinus consistent with resolving abscess and SQ cord to sphincter. Suspicious for anal fistula.  No fissure or active abscess. No external sphincters. Normal sphincter tone. Tolerated digital and anoscopic exam. Grade 1 internal hemorrhoids. Prostate smooth and only mildly enlarged without any nodularity. No frank proctitis. No pilonidal disease. No condyloma.   Peripheral Vascular Upper Extremity Inspection - Left - No Cyanotic nailbeds - Left, Not Ischemic. Inspection - Right  - No Cyanotic nailbeds - Right, Not Ischemic.  Neurologic Neurologic evaluation reveals  - normal attention span and ability to concentrate, able to name objects and repeat phrases. Appropriate fund of knowledge , normal sensation and normal coordination. Mental Status Affect - not angry, not paranoid. Cranial Nerves - Normal Bilaterally. Gait - Normal.  Neuropsychiatric Mental status exam performed with findings of - able to articulate well with normal speech/language, rate, volume and coherence, thought content normal with ability to perform basic computations and apply abstract reasoning and no evidence of hallucinations, delusions, obsessions or homicidal/suicidal ideation.  Musculoskeletal Global Assessment Spine, Ribs and Pelvis - no instability, subluxation or laxity. Right Upper Extremity - no instability, subluxation or laxity.  Lymphatic Head & Neck  General Head & Neck Lymphatics: Bilateral - Description - No Localized lymphadenopathy. Axillary  General Axillary Region: Bilateral - Description - No Localized lymphadenopathy. Femoral & Inguinal  Generalized Femoral & Inguinal Lymphatics: Left - Description - No Localized lymphadenopathy. Right - Description - No Localized lymphadenopathy.   Results [name] MD; 01/27/2021 5:47 PM) Procedures  Name Value Date Hemorrhoids Procedure   Other: Right anterior sinus consistent with resolving abscess and SQ cord to sphincter.  Suspicious for anal fistula............Marland KitchenNo fissure or active abscess.  No external sphincters.  Normal sphincter tone.  Tolerated digital and anoscopic exam.  Grade 1 internal hemorrhoids.  Prostate smooth and only mildly enlarged without any nodularity.  No frank proctitis.  No pilonidal disease.  No condyloma.  Performed: 01/27/2021 11:46 AM  Assessment & Plan Ardeth Sportsman(Shemika Robbs C. Zaniyah Wernette MD; 01/27/2021 5:47 PM) ANAL FISTULA (K60.3) Impression: Recurrent perianal pain swelling with abscess and now  recurrent drainage highly suspicious for fistula.  I think he would benefit from examination under anesthesia. Probable layered LIFT repair versus fistulotomy. This will allow me to do hemorrhoidal ligation and pexy and possible hemorrhoidectomy's as well. I had a long discussion about the technique. He and especially his wife wanted hyper detailed descriptions of the technique procedure. I noted about a 80-85% heel rate. Failures usually happen in smokers or people with bad diarrhea or inflammatory bowel disease. I encouraged him to quit smoking. He is frustrated and wishes to do something. He is interested in proceeding with surgery. We will try and coordinate convenient time. Current Plans Pt Education - CCS Abscess/Fistula (AT): discussed with patient and provided information. The anatomy & physiology of the anorectal region was discussed.  We discussed the pathophysiology of anorectal abscess and fistula.  Differential diagnosis was discussed.  Natural history progression was discussed.   I stressed the importance of a bowel regimen to have daily soft bowel movements to minimize progression of disease.    The patient's condition is not adequately controlled.  Non-operative treatment has not healed the fistula.  Therefore, I recommended examination under anaesthesia to confirm the diagnosis and treat the fistula.  I discussed techniques that may be required such as fistulotomy, ligation by LIFT technique, and/or seton placement.  Benefits & alternatives discussed.  I noted a good likelihood this will help address the problem, but sometimes repeat operations and prolonged healing times may occur.  Risks such as bleeding, pain, recurrence, reoperation, incontinence, heart attack, death, and other risks were discussed.     Educational handouts further explaining the pathology, treatment options, and bowel regimen were given.  The patient expressed understanding & wishes to proceed.  We will work to  coordinate surgery for a mutually convenient time.  Pt Education - CCS Rectal Prep for Anorectal outpatient/office surgery: discussed with patient and provided information. ANOSCOPY, DIAGNOSTIC ([id number]) Restarted Amoxicillin-Pot Clavulanate 875-125 MG Oral Tablet, 1 (one) Tablet twice a day, #10, 5 days starting 01/27/2021, Ref. x2. HISTORY OF ADENOMATOUS POLYP OF COLON (Z86.010) Current Plans Pt Education - Polyps in the Colon and Rectum (Colonic and Rectal Polyps): colonic polyps FAMILY HISTORY OF COLONIC POLYPS (Z83.71) TOBACCO ABUSE (Z72.0) Current Plans Pt Education - CCS STOP SMOKING!    Duane Porter  has presented today for surgery, with the diagnosis of PERIRECTAL FISTULA.  The various methods of treatment have been discussed with the patient and family. After consideration of risks, benefits and other options for treatment, the patient has consented to  Procedure(s): EXAM UNDER ANESTHESIA WITH ANAL FISTULA REPAIR EXAM UNDER ANESTHESIA WITH HEMORRHOIDECTOMY (N/A) RECTAL EXAM UNDER ANESTHESIA (N/A) as a surgical intervention.  The patient's history has been reviewed, patient examined, no change in status, stable for surgery.  I have reviewed the patient's chart and labs.  Questions were answered to the patient's satisfaction.    I have re-reviewed the the patient's records, history, medications, and allergies.  I have re-examined the patient.  I again discussed intraoperative plans and goals of post-operative recovery.  The patient agrees to proceed.  Duane Porter  07/22/1956 [id number]  Patient Care Team: Elfredia NevinsFusco, Lawrence, MD as PCP - General (Internal Medicine) Jena Gaussourk, Gerrit Friendsobert M, MD as Consulting Physician (Gastroenterology) Karie SodaGross, Benaiah Behan, MD as Consulting Physician (General Surgery)  There are no problems to display for this patient.  Past Medical History:  Diagnosis Date   History of 2019 novel coronavirus disease (COVID-19) 02/21/2020   positive result in epic, per pt  moderate symptoms (but does not believe he had covid) and stated dx tick born disease , treated and all symptoms resolved   History of adenomatous polyp of colon    Perirectal fistula    Wears glasses     Past Surgical History:  Procedure Laterality Date   COLONOSCOPY N/A 10/29/2013   Procedure: COLONOSCOPY;  Surgeon: Daneil Dolin, MD;  Location: [facility];  Service: Endoscopy;  Laterality: N/A;  8:30 AM   COLONOSCOPY N/A 02/21/2019   Procedure: COLONOSCOPY;  Surgeon: Daneil Dolin, MD;  Location: [facility];  Service: Endoscopy;  Laterality: N/A;  12:00   COLONOSCOPY     POLYPECTOMY  02/21/2019   Procedure: POLYPECTOMY;  Surgeon: Daneil Dolin, MD;  Location: [facility];  Service: Endoscopy;;   TONSILLECTOMY AND ADENOIDECTOMY  age 61    Social History   Socioeconomic History   Marital status: Married    Spouse name: Not on file   Number of children: Not on file   Years of education: Not on file   Highest education level: Not on file  Occupational History   Not on file  Tobacco Use   Smoking status: Former Smoker    Years: 5.00    Types: Cigarettes    Quit date: 01/28/2001    Years since quitting: 20.0   Smokeless tobacco: Never Used  Vaping Use   Vaping Use: Never used  Substance and Sexual Activity   Alcohol use: Yes    Alcohol/week: 7.0 standard drinks    Types: 7 Standard drinks or equivalent per week    Comment: per pt one drink per day   Drug use: Never   Sexual activity: Not on file  Other Topics Concern   Not on file  Social History Narrative   Not on file   Social Determinants of Health   Financial Resource Strain: Not on file  Food Insecurity: Not on file  Transportation Needs: Not on file  Physical Activity: Not on file  Stress: Not on file  Social Connections: Not on file  Intimate Partner Violence: Not on file    Family History  Problem Relation Age of Onset   Colon cancer Other    Colon cancer Paternal Aunt     No medications  prior to admission.    Current Facility-Administered Medications  Medication Dose Route Frequency Provider Last Rate Last Admin   0.9 %  sodium chloride infusion   Intravenous Continuous Ellender, Karyl Kinnier, MD 50 mL/hr at 01/29/21 1256 New Bag at 01/29/21 1256   bupivacaine liposome (EXPAREL) 1.3 % injection 266 mg  20 mL Infiltration Once Michael Boston, MD       Derrill Memo ON 01/30/2021] cefTRIAXone (ROCEPHIN) 2 g in sodium chloride 0.9 % 100 mL IVPB  2 g Intravenous On Call to OR Michael Boston, MD       And   Derrill Memo ON 01/30/2021] metroNIDAZOLE (FLAGYL) IVPB 500 mg  500 mg Intravenous On Call to OR Michael Boston, MD       Derrill Memo ON 01/30/2021] celecoxib (CELEBREX) capsule 200 mg  200 mg Oral On Call to OR Michael Boston, MD       Chlorhexidine Gluconate Cloth 2 % PADS 6 each  6 each Topical Once Michael Boston, MD       And   Chlorhexidine Gluconate Cloth 2 %  PADS 6 each  6 each Topical Once Michael Boston, MD       Derrill Memo ON 01/30/2021] feeding supplement (ENSURE PRE-SURGERY) liquid 296 mL  296 mL Oral Once Michael Boston, MD       Derrill Memo ON 01/30/2021] gabapentin (NEURONTIN) capsule 300 mg  300 mg Oral On Call to OR Michael Boston, MD         No Known Allergies  BP 116/74   Pulse 79   Temp 97.7 F (36.5 C) (Oral)   Resp 16   Ht 5\' 11"  (1.803 m)   Wt 76.9 kg   SpO2 100%   BMI 23.65 kg/m   Labs: Results for orders placed or performed during the hospital encounter of 01/28/21 (from the past 48 hour(s))  SARS CORONAVIRUS 2 (TAT 6-24 HRS) Nasopharyngeal Nasopharyngeal Swab     Status: None   Collection Time: 01/28/21  8:19 AM   Specimen: Nasopharyngeal Swab  Result Value Ref Range   SARS Coronavirus 2 NEGATIVE NEGATIVE    Comment: (NOTE) SARS-CoV-2 target nucleic acids are NOT DETECTED.  The SARS-CoV-2 RNA is generally detectable in upper and lower respiratory specimens during the acute phase of infection. Negative results do not preclude SARS-CoV-2 infection, do not rule out co-infections with  other pathogens, and should not be used as the sole basis for treatment or other patient management decisions. Negative results must be combined with clinical observations, patient history, and epidemiological information. The expected result is Negative.  Fact Sheet for Patients: SugarRoll.be  Fact Sheet for Healthcare Providers: https://www.woods-mathews.com/  This test is not yet approved or cleared by the [place] FDA and  has been authorized for detection and/or diagnosis of SARS-CoV-2 by FDA under an Emergency Use Authorization (EUA). This EUA will remain  in effect (meaning this test can be used) for the duration of the COVID-19 declaration under Se ction 564(b)(1) of the Act, 21 U.S.C. section 360bbb-3(b)(1), unless the authorization is terminated or revoked sooner.  Performed at [facility], [place] [address] [place], [place] [address]     Imaging / Studies: No results found.   [name], M.D., F.A.C.S. Gastrointestinal and Minimally Invasive Surgery [facility] [address] [place],  [id number] [phone] Main / Paging  01/29/2021 1:17 PM    [name]

## 2021-01-29 NOTE — Op Note (Signed)
01/29/2021  3:11 PM  PATIENT:  Duane Porter  65 y.o. male  Patient Care Team: Redmond School, MD as PCP - General (Internal Medicine) Gala Romney, Cristopher Estimable, MD as Consulting Physician (Gastroenterology) Michael Boston, MD as Consulting Physician (General Surgery)  PRE-OPERATIVE DIAGNOSIS:  PERIRECTAL FISTULA  POST-OPERATIVE DIAGNOSIS:  PERIRECTAL SINUS TRACT  PROCEDURE:   EXCISION OF PERIRECTAL SINUS TRACT ANORECTAL EXAM UNDER ANESTHESIA  SURGEON:  [name], MD  ASSISTANT: OR Staff   ANESTHESIA:   General Anorectal & Local field block (0.25% bupivacaine with epinephrine mixed with Liposomal bupivacaine (Experel)    EBL:  No intake/output data recorded..  See anesthesia record  Delay start of Pharmacological VTE agent (>24hrs) due to surgical blood loss or risk of bleeding:  no  DRAINS: none   SPECIMEN:  Source of Specimen:  External perirectal sinus tract  DISPOSITION OF SPECIMEN:  PATHOLOGY  COUNTS:  YES  PLAN OF CARE: Discharge to home after PACU  PATIENT DISPOSITION:  PACU - hemodynamically stable.  INDICATION: Patient with probable perirectal fistula.  I recommended examination and surgical treatment:  The anatomy & physiology of the anorectal region was discussed.  We discussed the pathophysiology of anorectal abscess and fistula.  Differential diagnosis was discussed.  Natural history progression was discussed.   I stressed the importance of a bowel regimen to have daily soft bowel movements to minimize progression of disease.     The patient's condition is not adequately controlled.  Non-operative treatment has not healed the fistula.  Therefore, I recommended examination under anaesthesia to confirm the diagnosis and treat the fistula.  I discussed techniques that may be required such as fistulotomy, ligation by LIFT technique, and/or seton placement.  Benefits & alternatives discussed.  I noted a good likelihood this will help address the problem, but  sometimes repeat operations and prolonged healing times may occur.  Risks such as bleeding, pain, recurrence, reoperation, incontinence, heart attack, death, and other risks were discussed.      Educational handouts further explaining the pathology, treatment options, and bowel regimen were given.  The patient expressed understanding & wishes to proceed.  We will work to coordinate surgery for a mutually convenient time.   OR FINDINGS: Patient had a chronic perirectal wound with a sinus tract going to the opposite anterior perirectal region.  There was no evidence of any superficial or intersphincteric fistula.  No evidence of any internal opening.  No enlarged hemorrhoids or proctitis.  Anal canal without rectal scarring nor stricture.  No suspicion for Crohn disease.  No anterior midline fissure or posterior midline fissure.  External location RIGHT ANTERIOR   about 3 cm from anal verge.  Internal location :  NONE -sinus tract runs to left anterior midline ischio rectal fat.  Does not coursed through the sphincters.  No evidence of any intersphincteric fistula..  DESCRIPTION:   Informed consent was confirmed. Patient underwent general anesthesia without difficulty. Patient was placed into prone positioning.  The perianal region was prepped and draped in sterile fashion. Surgical timeout confirmed or plan.  I did digital rectal examination and then transitioned over to anoscopy to get a sense of the anatomy.  I did place a probe through the external opening it seemed to go proximally but stopped near the level of the anterior midline sphincter complex.  Somewhat left anterior..  I also injected the track with methylene blue.  With this I was able to locate an internal opening.  There was no methylene blue coming  up at the anal verge, anal crypt, rectum.  Nothing anterior midline nor circumferentially.  The anal canal and anal crypts look noninflamed and normal/underwhelming.  No enlarged or inflamed  hemorrhoids.  No other concerns.  Anatomy quite normal.  Internal and external sphincter complex felt normal without any breach.  No abscess located.  I began to excise the external opening with a radial biconcave incision around it.  I transitioned to cautery and help free the fistulous tract circumferentially all way down towards the sphincter component.  I came around a small chronic abscess cavity.  It did seem to have a sinus tract that was clearly going towards the sphincter complex but then veered to the left anterior rectal space.  With the sinus tract straighten out I tried to probe to no avail.  I did a doc tenotomy more proximally to no avail.  I injected with methylene blue and again encountered no methylene blue going up into the anal canal or rectum.  This argues against any evidence of a true anal fistula.  I cannot find a proximal opening.  It just stopped in the left anterior ischial rectal rectal space at the base of the left anterior sphincter complex.  I continued sharp dissection to skeletonized the methylene blue stained abscess cavity and sinus tract until it came up to the sphincter complex improved that it just stop and ended.  Again could not find any opening going superficial nor within or external to the sphincter complex.  No evidence of fistula.  Therefore I excised the apex of the tract in the left anterior ischio rectal space.  I did do a figure-of-eight suture at its base to make sure there was no possibility of any missed fistulous tract opening up.  Patient had some extra right anterior ischiaorectal fat that fell into the wound well.  I used some interrupted chromic sutures to lay that fat pad over the base of the sphincter complex as a gluteal Martius-like rotational fat flap.  That filled up the deeper part of the wound so the resulting perirectal wound was much more superficial.  I excised the skin edges so that I had a open flat right anterior perirectal wound.ng and  protecting the LIFT repair.  Hemostasis was excellent.  I reexamined the anal canal.   Internal and external sphincter complex was intact without injury.  There was no narrowing.  Hemostasis was excellent.  I repeated anoscopy and examination.  Hemostasis was good.  We placed fluff gauze to onlay over the wounds.  No packing done.  Patient is being extubated go to recovery room.  I am about to discuss the patient's status to the family.  Instructions are written as well.  I discussed operative findings, updated the patient's status, discussed probable steps to recovery, and gave postoperative recommendations to the patient's spouse.  Recommendations were made.  Questions were answered.  She expressed understanding & appreciation.   [name], M.D., F.A.C.S. Gastrointestinal and Minimally Invasive Surgery [facility] [address] [place], [place] [id number] [phone] Main / Paging

## 2021-01-29 NOTE — Anesthesia Procedure Notes (Signed)
Procedure Name: Intubation Date/Time: 01/29/2021 2:18 PM Performed by: Effie Berkshire, MD Pre-anesthesia Checklist: Patient identified, Emergency Drugs available, Suction available and Patient being monitored Patient Re-evaluated:Patient Re-evaluated prior to induction Oxygen Delivery Method: Circle system utilized Preoxygenation: Pre-oxygenation with 100% oxygen Induction Type: IV induction Ventilation: Mask ventilation without difficulty Laryngoscope Size: Mac and 4 Grade View: Grade II Tube type: Oral Number of attempts: 1 Airway Equipment and Method: Stylet and Oral airway Placement Confirmation: ETT inserted through vocal cords under direct vision,  positive ETCO2 and breath sounds checked- equal and bilateral Secured at: 23 cm Tube secured with: Tape Dental Injury: Teeth and Oropharynx as per pre-operative assessment

## 2021-01-29 NOTE — Anesthesia Preprocedure Evaluation (Addendum)
Anesthesia Evaluation  Patient identified by MRN, date of birth, ID band Patient awake    Reviewed: Allergy & Precautions, NPO status , Patient's Chart, lab work & pertinent test results  History of Anesthesia Complications Negative for: history of anesthetic complications  Airway Mallampati: II  TM Distance: >3 FB Neck ROM: Full    Dental no notable dental hx.    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  Reproductive/Obstetrics negative OB ROS                            Anesthesia Physical Anesthesia Plan  ASA: II  Anesthesia Plan: General   Post-op Pain Management:    Induction: Intravenous  PONV Risk Score and Plan: 2 and Treatment may vary due to age or medical condition, Ondansetron, Dexamethasone and Midazolam  Airway Management Planned: Oral ETT  Additional Equipment: None  Intra-op Plan:   Post-operative Plan: Extubation in OR  Informed Consent: I have reviewed the patients History and Physical, chart, labs and discussed the procedure including the risks, benefits and alternatives for the proposed anesthesia with the patient or authorized representative who has indicated his/her understanding and acceptance.     Dental advisory given  Plan Discussed with: CRNA  Anesthesia Plan Comments:        Anesthesia Quick Evaluation

## 2021-01-29 NOTE — Discharge Instructions (Signed)
ANORECTAL SURGERY:  POST OPERATIVE INSTRUCTIONS  ######################################################################  EAT Start with a pureed / full liquid diet After 24 hours, gradually transition to a high fiber diet.    CONTROL PAIN Control pain so you can tolerate bowel movements,  walk, sleep, tolerate sneezing/coughing, and go up/down stairs.   HAVE A BOWEL MOVEMENT DAILY Keep your bowels regular to avoid problems.   Taking a fiber supplement every day to keep bowels soft.   Try a laxative to override constipation. Use an antidairrheal to slow down diarrhea.   Call if not better after 2 tries  WALK Walk an hour a day.  Control your pain to do that.   CALL IF YOU HAVE PROBLEMS/CONCERNS Call if you are still struggling despite following these instructions. Call if you have concerns not answered by these instructions  ######################################################################    1. Take your usually prescribed home medications unless otherwise directed.  2. DIET: Follow a light bland diet & liquids the first 24 hours after arrival home, such as soup, liquids, starches, etc.  Be sure to drink plenty of fluids.  Quickly advance to a usual solid diet within a few days.  Avoid fast food or heavy meals as your are more likely to get nauseated or have irregular bowels.  A low-fat, high-fiber diet for the rest of your life is ideal.  3. PAIN CONTROL: a. Pain is best controlled by a usual combination of three different methods TOGETHER: i. Ice/Heat ii. Over the counter pain medication iii. Prescription pain medication b. Expect swelling and discomfort in the anus/rectal area.  Warm water baths (30-60 minutes up to 6 times a day, especially after bowel meovements) will help. Use ice for the first few days to help decrease swelling and bruising, then switch to heat such as warm towels, sitz baths, warm baths, etc to help relax tight/sore spots and speed recovery.   Some people prefer to use ice alone, heat alone, alternating between ice & heat.  Experiment to what works for you.   c. It is helpful to take an over-the-counter pain medication continuously for the first few weeks.  Choose one of the following that works best for you: i. Naproxen (Aleve, etc)  Two 250m tabs twice a day ii. Ibuprofen (Advil, etc) Three 2072mtabs four times a day (every meal & bedtime) iii. Acetaminophen (Tylenol, etc) 500-65044mour times a day (every meal & bedtime) d. A  prescription for pain medication (such as oxycodone, hydrocodone, etc) should be given to you upon discharge.  Take your pain medication as prescribed.  i. If you are having problems/concerns with the prescription medicine (does not control pain, nausea, vomiting, rash, itching, etc), please call us [phone] see if we need to switch you to a different pain medicine that will work better for you and/or control your side effect better. ii. If you need a refill on your pain medication, please contact your pharmacy.  They will contact our office to request authorization. Prescriptions will not be filled after 5 pm or on week-ends.  If can take up to 48 hours for it to be filled & ready so avoid waiting until you are down to thel ast pill. e. A topical cream (Dibucaine) or a prescription for a cream (such as diltiazem 2% gel) may be given to you.  Many people find relief with topical creams.  Some people find it burns too much.  Experiment.  If it helps, use it.  If it burns, don't using  it.  Use a Sitz Bath 4-8 times a day for relief   [employer] A sitz bath is a warm water bath taken in the sitting position that covers only the hips and buttocks. It may be used for either healing or hygiene purposes. Sitz baths are also used to relieve pain, itching, or muscle spasms. The water may contain medicine. Moist heat will help you heal and relax.  HOME CARE INSTRUCTIONS  Take 3 to 4 sitz baths a day. 1. Fill the  bathtub half full with warm water. 2. Sit in the water and open the drain a little. 3. Turn on the warm water to keep the tub half full. Keep the water running constantly. 4. Soak in the water for 15 to 20 minutes. 5. After the sitz bath, pat the affected area dry first.   4. KEEP YOUR BOWELS REGULAR a. The goal is one soft bowel movement a day b. Avoid getting constipated.  Between the surgery and the pain medications, it is common to experience some constipation.  Increasing fluid intake and taking a fiber supplement (such as Metamucil, Citrucel, FiberCon, MiraLax, etc) 2-3 times a day regularly will usually help prevent this problem from occurring.  A mild laxative (prune juice, Milk of Magnesia, MiraLax, etc) should be taken according to package directions if there are no bowel movements after 48 hours. c. Watch out for diarrhea.  If you have many loose bowel movements, simplify your diet to bland foods & liquids for a few days.  Stop any stool softeners and decrease your fiber supplement.  Switching to mild anti-diarrheal medications (Kayopectate, Pepto Bismol) can help.  Can try an imodium/loperamide dose.  If this worsens or does not improve, please call us.  5. Wound Care  a. Remove your bandages with your first bowel movement, usually the day after surgery.  Let the gauze fall off with the first bowel movement or shower.   b. Wear an absorbent pad or soft cotton balls in your underwear as needed to catch any drainage and help keep the area  c. Keep the area clean and dry.  Bathe / shower every day.  Keep the area clean by showering / bathing over the incision / wound.   It is okay to soak an open wound to help wash it.  Consider using a squeeze bottle filled with warm water to gently wash the anal area.  Wet wipes or showers / gentle washing after bowel movements is often less traumatic than regular toilet paper. d. Dennis Bast will often notice bleeding with bowel movements.  This should slow down  by the end of the first week of surgery.  Sitting on an ice pack can help. e. Expect some drainage.  This should slow down by the end of the first week of surgery, but you will have occasional bleeding or drainage up to a few months after surgery.  Wear an absorbent pad or soft cotton gauze in your underwear until the drainage stops.  6. ACTIVITIES as tolerated:   a. You may resume regular (light) daily activities beginning the next day--such as daily self-care, walking, climbing stairs--gradually increasing activities as tolerated.  If you can walk 30 minutes without difficulty, it is safe to try more intense activity such as jogging, treadmill, bicycling, low-impact aerobics, swimming, etc. b. Save the most intensive and strenuous activity for last such as sit-ups, heavy lifting, contact sports, etc  Refrain from any heavy lifting or straining until you are off narcotics for  pain control.   c. DO NOT PUSH THROUGH PAIN.  Let pain be your guide: If it hurts to do something, don't do it.  Pain is your body warning you to avoid that activity for another week until the pain goes down. d. You may drive when you are no longer taking prescription pain medication, you can comfortably sit for long periods of time, and you can safely maneuver your car and apply brakes. e. Dennis Bast may have sexual intercourse when it is comfortable.  7. FOLLOW UP in our office a. Please call [facility] at [phone] to set up an appointment to see your surgeon in the office for a follow-up appointment approximately 2-3 weeks after your surgery. b. Make sure that you call for this appointment the day you arrive home to ensure a convenient appointment time.  8. IF YOU HAVE DISABILITY OR FAMILY LEAVE FORMS, BRING THEM TO THE OFFICE FOR PROCESSING.  DO NOT GIVE THEM TO YOUR DOCTOR.        WHEN TO CALL us [phone]: 1. Poor pain control 2. Reactions / problems with new medications (rash/itching, nausea, etc)  3. Fever over  101.5 F (38.5 C) 4. Inability to urinate 5. Nausea and/or vomiting 6. Worsening swelling or bruising 7. Continued bleeding from incision. 8. Increased pain, redness, or drainage from the incision  The clinic staff is available to answer your questions during regular business hours (8:30am-5pm).  Please don't hesitate to call and ask to speak to one of our nurses for clinical concerns.   A surgeon from [facility] is always on call at the hospitals   If you have a medical emergency, go to the nearest emergency room or call 911.    [facility], [address], [place], [place], [place]  [address] ? MAIN: [phone] ? TOLL FREE: [phone] ? FAX [phone] [email or web address]   ######################################################    Anal Fistula  An anal fistula is a hole that develops between the bowel and the skin near the anus. The anus allows stool (feces) to leave the body. The anus has many tiny glands that make lubricating fluid. Sometimes, these glands become plugged and infected. This can cause a fluid-filled pocket (abscess) to form. An anal fistula often occurs when an abscess becomes infected and then develops into a hole between the bowel and the skin. What are the causes? In most cases, an anal fistula is caused by a past or current buildup of pus around the anus (anal abscess). Other causes include:  A complication of surgery.  Injury to the rectum or the area around it.  Using high-energy beams (radiation) to treat the area around the rectum. What increases the risk? You are more likely to develop this condition if you have certain medical conditions or diseases, including:  Chronic inflammatory bowel disease, such as Crohn's disease or ulcerative colitis.  Colon cancer or rectal cancer.  Diverticular disease, such as diverticulitis.  A sexually transmitted infection, or STI, such as gonorrhea,  chlamydia, or syphilis.  An infection that is caused by HIV. What are the signs or symptoms? Symptoms of this condition include:  Throbbing or constant pain that may be worse while you are sitting.  Swelling or irritation around the anus.  Pus or blood from an opening near the anus.  Pain when passing stool.  Fever or chills. How is this diagnosed? This condition is diagnosed based on:  A physical exam. This may include: ? An exam  to find the external opening of the fistula. ? An exam with a probe or scope to help locate the internal opening of the fistula. ? An exam of the rectum with a gloved hand (digital rectal exam).  Imaging tests that use dye to find the exact location and path of the fistula. Tests may include: ? X-rays. ? Ultrasound. ? CT scan. ? MRI.  Other tests to find the cause of the anal fistula. How is this treated? This condition is most commonly treated with surgery. The type of surgery that is used will depend on where the fistula is located and how complex the fistula is. Surgery may include:  A fistulotomy. The whole fistula is opened up, and the contents are drained to promote healing.  Seton placement. A silk string (seton) is placed into the fistula during a fistulotomy. This helps to drain any infection and promote healing.  Advancement flap procedure. Tissue is removed from your rectum or the skin around the anus and attached to the opening of the fistula.  Bioprosthetic plug. A cone-shaped plug is made from your tissue and is used to block the opening of the fistula. Some anal fistulas do not require surgery. A nonsurgical treatment option involves injecting a fibrin glue to seal the fistula. You also may be prescribed an antibiotic medicine to treat any infection. Follow these instructions at home: Medicines  Take over-the-counter and prescription medicines only as told by your health care provider.  If you were prescribed an antibiotic  medicine, take it as told by your health care provider. Do not stop taking the antibiotic even if you start to feel better.  Use a stool softener or a laxative if told to do so by your health care provider. General instructions  Eat a high-fiber diet as told by your health care provider. This can help to prevent constipation.  Drink enough fluid to keep your urine pale yellow.  Take a warm sitz bath for 15-20 minutes, 3-4 times per day, or as told by your health care provider. Sitz baths can ease your pain and discomfort and help with healing.  Follow good hygiene to keep the anal area as clean and dry as possible. Use wet toilet paper or a moist towelette after each bowel movement.  Keep all follow-up visits as told by your health care provider. This is important.   Contact a health care provider if you have:  Increased pain that is not controlled with medicines.  New redness or swelling around the anal area.  New fluid, blood, or pus coming from the anal area.  Tenderness or warmth around the anal area. Get help right away if you have:  A fever.  Severe pain.  Chills or diarrhea.  Severe problems urinating or having a bowel movement. Summary  An anal fistula is a hole that develops between the bowel and the skin near the anus.  This condition is most often caused by a buildup of pus around the anus (anal abscess). Other causes include a complication of surgery, an injury to the rectum, or the use of radiation to treat the rectal area.  This condition is most commonly treated with surgery.  Follow your health care provider's instructions about taking medicines, eating and drinking, or taking sitz baths.  Call your health care provider if you have more pain, swelling, or blood. Get help right away if you have fever, severe pain, or problems passing urine or stool. This information is not intended to replace advice  given to you by your health care provider. Make sure you  discuss any questions you have with your health care provider. Document Revised: 01/27/2018 Document Reviewed: 01/27/2018 Elsevier Patient Education  [place] Instructions  Activity: Get plenty of rest for the remainder of the day. A responsible individual must stay with you for 24 hours following the procedure.  For the next 24 hours, DO NOT: -Drive a car -Paediatric nurse -Drink alcoholic beverages -Take any medication unless instructed by your physician -Make any legal decisions or sign important papers.  Meals: Start with liquid foods such as gelatin or soup. Progress to regular foods as tolerated. Avoid greasy, spicy, heavy foods. If nausea and/or vomiting occur, drink only clear liquids until the nausea and/or vomiting subsides. Call your physician if vomiting continues.  Special Instructions/Symptoms: Your throat may feel dry or sore from the anesthesia or the breathing tube placed in your throat during surgery. If this causes discomfort, gargle with warm salt water. The discomfort should disappear within 24 hours.  If you had a scopolamine patch placed behind your ear for the management of post- operative nausea and/or vomiting:  1. The medication in the patch is effective for 72 hours, after which it should be removed.  Wrap patch in a tissue and discard in the trash. Wash hands thoroughly with soap and water. 2. You may remove the patch earlier than 72 hours if you experience unpleasant side effects which may include dry mouth, dizziness or visual disturbances. 3. Avoid touching the patch. Wash your hands with soap and water after contact with the patch.    Information for Discharge Teaching: EXPAREL (bupivacaine liposome injectable suspension)   Your surgeon or anesthesiologist gave you EXPAREL(bupivacaine) to help control your pain after surgery.   EXPAREL is a local anesthetic that provides pain relief by numbing the tissue around  the surgical site.  EXPAREL is designed to release pain medication over time and can control pain for up to 72 hours.  Depending on how you respond to EXPAREL, you may require less pain medication during your recovery.  Possible side effects:  Temporary loss of sensation or ability to move in the area where bupivacaine was injected.  Nausea, vomiting, constipation  Rarely, numbness and tingling in your mouth or lips, lightheadedness, or anxiety may occur.  Call your doctor right away if you think you may be experiencing any of these sensations, or if you have other questions regarding possible side effects.  Follow all other discharge instructions given to you by your surgeon or nurse. Eat a healthy diet and drink plenty of water or other fluids.  If you return to the hospital for any reason within 96 hours following the administration of EXPAREL, it is important for health care providers to know that you have received this anesthetic. A teal colored band has been placed on your arm with the date, time and amount of EXPAREL you have received in order to alert and inform your health care providers. Please leave this armband in place for the full 96 hours following administration, and then you may remove the band.

## 2021-01-29 NOTE — Transfer of Care (Signed)
Immediate Anesthesia Transfer of Care Note  Patient: Duane Porter  Procedure(s) Performed: Procedure(s) (LRB): EXAM UNDER ANESTHESIA WITH EXCISION OF PERIRECTAL SINUS TRACT (N/A) RECTAL EXAM UNDER ANESTHESIA (N/A)  Patient Location: PACU  Anesthesia Type: General  Level of Consciousness: awake, sedated, patient cooperative and responds to stimulation  Airway & Oxygen Therapy: Patient Spontanous Breathing and Patient connected to [place] 02 and soft FM   Post-op Assessment: Report given to PACU RN, Post -op Vital signs reviewed and stable and Patient moving all extremities  Post vital signs: Reviewed and stable  Complications: No apparent anesthesia complications

## 2021-01-30 ENCOUNTER — Encounter (HOSPITAL_BASED_OUTPATIENT_CLINIC_OR_DEPARTMENT_OTHER): Payer: Self-pay | Admitting: Surgery

## 2021-01-31 NOTE — Anesthesia Postprocedure Evaluation (Signed)
Anesthesia Post Note  Patient: Duane Porter  Procedure(s) Performed: EXAM UNDER ANESTHESIA WITH EXCISION OF PERIRECTAL SINUS TRACT (N/A Rectum) RECTAL EXAM UNDER ANESTHESIA (N/A Rectum)     Patient location during evaluation: PACU Anesthesia Type: General Level of consciousness: awake and alert Pain management: pain level controlled Vital Signs Assessment: post-procedure vital signs reviewed and stable Respiratory status: spontaneous breathing, nonlabored ventilation, respiratory function stable and patient connected to nasal cannula oxygen Cardiovascular status: blood pressure returned to baseline and stable Postop Assessment: no apparent nausea or vomiting Anesthetic complications: no   No complications documented.  Last Vitals:  Vitals:   01/29/21 1615 01/29/21 1720  BP: (!) 131/92 (!) 146/90  Pulse: 63 65  Resp: 11 14  Temp: 36.4 C 36.4 C  SpO2: 100% 100%    Last Pain:  Vitals:   01/29/21 1720  TempSrc:   PainSc: 0-No pain                 Effie Berkshire

## 2021-02-02 LAB — SURGICAL PATHOLOGY

## 2021-03-05 IMAGING — DX DG CHEST 1V PORT
1 series · 1 of 1 positions shown · non-contrast
Comparison: None.

CLINICAL DATA: Fever, weakness.

EXAM:
PORTABLE CHEST 1 VIEW

[chest ap]
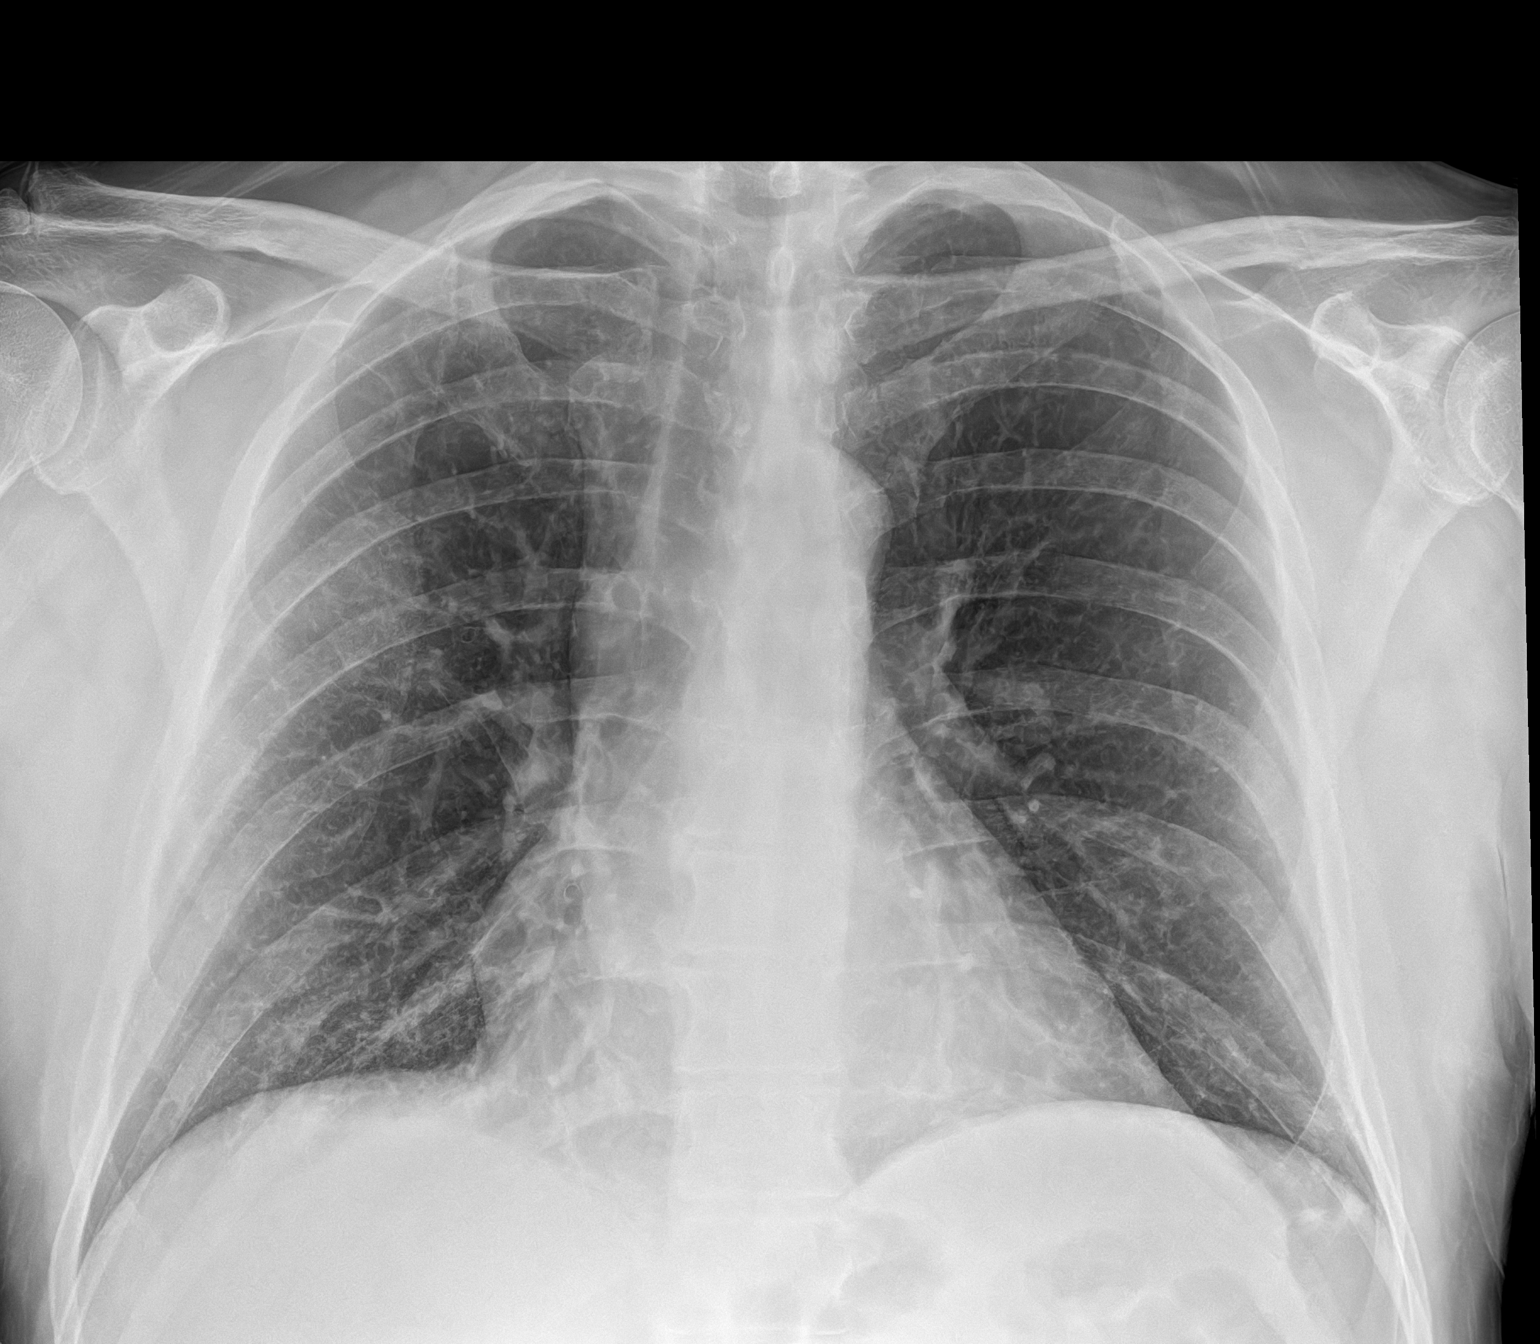

[1 of 1 positions shown; findings below may reference images not displayed]

FINDINGS: The heart size and mediastinal contours are within normal limits.
Both lungs are clear. The visualized skeletal structures are
unremarkable.
IMPRESSION: No active disease.

## 2022-06-16 DIAGNOSIS — R69 Illness, unspecified: Secondary | ICD-10-CM | POA: Diagnosis not present

## 2022-06-16 DIAGNOSIS — Z6826 Body mass index (BMI) 26.0-26.9, adult: Secondary | ICD-10-CM | POA: Diagnosis not present

## 2022-06-16 DIAGNOSIS — B356 Tinea cruris: Secondary | ICD-10-CM | POA: Diagnosis not present

## 2022-09-13 DIAGNOSIS — H6501 Acute serous otitis media, right ear: Secondary | ICD-10-CM | POA: Diagnosis not present

## 2022-09-13 DIAGNOSIS — H6981 Other specified disorders of Eustachian tube, right ear: Secondary | ICD-10-CM | POA: Diagnosis not present

## 2022-09-13 DIAGNOSIS — Z6826 Body mass index (BMI) 26.0-26.9, adult: Secondary | ICD-10-CM | POA: Diagnosis not present

## 2022-09-13 DIAGNOSIS — J0110 Acute frontal sinusitis, unspecified: Secondary | ICD-10-CM | POA: Diagnosis not present

## 2022-09-13 DIAGNOSIS — R69 Illness, unspecified: Secondary | ICD-10-CM | POA: Diagnosis not present

## 2022-09-13 DIAGNOSIS — E663 Overweight: Secondary | ICD-10-CM | POA: Diagnosis not present

## 2023-04-07 DIAGNOSIS — Z1283 Encounter for screening for malignant neoplasm of skin: Secondary | ICD-10-CM | POA: Diagnosis not present

## 2023-04-07 DIAGNOSIS — B9689 Other specified bacterial agents as the cause of diseases classified elsewhere: Secondary | ICD-10-CM | POA: Diagnosis not present

## 2023-04-07 DIAGNOSIS — D225 Melanocytic nevi of trunk: Secondary | ICD-10-CM | POA: Diagnosis not present

## 2023-04-07 DIAGNOSIS — L820 Inflamed seborrheic keratosis: Secondary | ICD-10-CM | POA: Diagnosis not present

## 2023-04-07 DIAGNOSIS — L0202 Furuncle of face: Secondary | ICD-10-CM | POA: Diagnosis not present

## 2023-04-21 DIAGNOSIS — H43391 Other vitreous opacities, right eye: Secondary | ICD-10-CM | POA: Diagnosis not present

## 2023-05-17 DIAGNOSIS — E559 Vitamin D deficiency, unspecified: Secondary | ICD-10-CM | POA: Diagnosis not present

## 2023-05-17 DIAGNOSIS — G9332 Myalgic encephalomyelitis/chronic fatigue syndrome: Secondary | ICD-10-CM | POA: Diagnosis not present

## 2023-05-17 DIAGNOSIS — Z0001 Encounter for general adult medical examination with abnormal findings: Secondary | ICD-10-CM | POA: Diagnosis not present

## 2023-05-17 DIAGNOSIS — D518 Other vitamin B12 deficiency anemias: Secondary | ICD-10-CM | POA: Diagnosis not present

## 2023-05-17 DIAGNOSIS — Z1331 Encounter for screening for depression: Secondary | ICD-10-CM | POA: Diagnosis not present

## 2023-05-17 DIAGNOSIS — E782 Mixed hyperlipidemia: Secondary | ICD-10-CM | POA: Diagnosis not present

## 2023-05-17 DIAGNOSIS — Z6825 Body mass index (BMI) 25.0-25.9, adult: Secondary | ICD-10-CM | POA: Diagnosis not present

## 2023-05-17 DIAGNOSIS — E663 Overweight: Secondary | ICD-10-CM | POA: Diagnosis not present

## 2023-05-17 DIAGNOSIS — Z125 Encounter for screening for malignant neoplasm of prostate: Secondary | ICD-10-CM | POA: Diagnosis not present

## 2023-05-19 ENCOUNTER — Encounter: Payer: Self-pay | Admitting: *Deleted

## 2023-05-31 DIAGNOSIS — M545 Low back pain, unspecified: Secondary | ICD-10-CM | POA: Diagnosis not present

## 2023-05-31 DIAGNOSIS — M25511 Pain in right shoulder: Secondary | ICD-10-CM | POA: Diagnosis not present

## 2023-06-09 DIAGNOSIS — M545 Low back pain, unspecified: Secondary | ICD-10-CM | POA: Diagnosis not present

## 2023-06-09 DIAGNOSIS — M25511 Pain in right shoulder: Secondary | ICD-10-CM | POA: Diagnosis not present

## 2023-06-16 DIAGNOSIS — M545 Low back pain, unspecified: Secondary | ICD-10-CM | POA: Diagnosis not present

## 2023-06-16 DIAGNOSIS — M25511 Pain in right shoulder: Secondary | ICD-10-CM | POA: Diagnosis not present

## 2023-06-21 DIAGNOSIS — M545 Low back pain, unspecified: Secondary | ICD-10-CM | POA: Diagnosis not present

## 2023-06-21 DIAGNOSIS — M25511 Pain in right shoulder: Secondary | ICD-10-CM | POA: Diagnosis not present

## 2023-10-05 ENCOUNTER — Other Ambulatory Visit (HOSPITAL_COMMUNITY): Payer: Self-pay | Admitting: Internal Medicine

## 2023-10-05 ENCOUNTER — Ambulatory Visit (HOSPITAL_COMMUNITY)
Admission: RE | Admit: 2023-10-05 | Discharge: 2023-10-05 | Disposition: A | Discharge status: Home / Self Care | Payer: HMO | Source: Ambulatory Visit | Attending: Internal Medicine | Admitting: Internal Medicine

## 2023-10-05 DIAGNOSIS — Z6826 Body mass index (BMI) 26.0-26.9, adult: Secondary | ICD-10-CM | POA: Diagnosis not present

## 2023-10-05 DIAGNOSIS — R0789 Other chest pain: Secondary | ICD-10-CM | POA: Insufficient documentation

## 2023-10-05 DIAGNOSIS — G9332 Myalgic encephalomyelitis/chronic fatigue syndrome: Secondary | ICD-10-CM | POA: Diagnosis not present

## 2023-10-05 DIAGNOSIS — K2100 Gastro-esophageal reflux disease with esophagitis, without bleeding: Secondary | ICD-10-CM | POA: Diagnosis not present

## 2023-10-05 DIAGNOSIS — D518 Other vitamin B12 deficiency anemias: Secondary | ICD-10-CM | POA: Diagnosis not present

## 2023-10-05 DIAGNOSIS — E663 Overweight: Secondary | ICD-10-CM | POA: Diagnosis not present

## 2023-10-05 DIAGNOSIS — E559 Vitamin D deficiency, unspecified: Secondary | ICD-10-CM | POA: Diagnosis not present

## 2023-10-05 DIAGNOSIS — R079 Chest pain, unspecified: Secondary | ICD-10-CM | POA: Diagnosis not present

## 2023-10-06 DIAGNOSIS — R0789 Other chest pain: Secondary | ICD-10-CM | POA: Diagnosis not present

## 2023-10-06 DIAGNOSIS — D518 Other vitamin B12 deficiency anemias: Secondary | ICD-10-CM | POA: Diagnosis not present

## 2023-10-06 DIAGNOSIS — K2100 Gastro-esophageal reflux disease with esophagitis, without bleeding: Secondary | ICD-10-CM | POA: Diagnosis not present

## 2023-10-06 DIAGNOSIS — E559 Vitamin D deficiency, unspecified: Secondary | ICD-10-CM | POA: Diagnosis not present

## 2023-10-10 ENCOUNTER — Other Ambulatory Visit (HOSPITAL_COMMUNITY): Payer: Self-pay | Admitting: Internal Medicine

## 2023-10-10 DIAGNOSIS — R0789 Other chest pain: Secondary | ICD-10-CM

## 2023-10-10 DIAGNOSIS — E7849 Other hyperlipidemia: Secondary | ICD-10-CM

## 2023-11-18 ENCOUNTER — Encounter: Payer: Self-pay | Admitting: Internal Medicine

## 2023-11-18 ENCOUNTER — Ambulatory Visit: Payer: HMO | Attending: Internal Medicine | Admitting: Internal Medicine

## 2023-11-18 VITALS — BP 168/98 | HR 70 | Ht 71.0 in | Wt 186.0 lb

## 2023-11-18 DIAGNOSIS — Z8249 Family history of ischemic heart disease and other diseases of the circulatory system: Secondary | ICD-10-CM | POA: Diagnosis not present

## 2023-11-18 DIAGNOSIS — R0789 Other chest pain: Secondary | ICD-10-CM

## 2023-11-18 DIAGNOSIS — I2510 Atherosclerotic heart disease of native coronary artery without angina pectoris: Secondary | ICD-10-CM | POA: Diagnosis not present

## 2023-11-18 DIAGNOSIS — R079 Chest pain, unspecified: Secondary | ICD-10-CM | POA: Diagnosis not present

## 2023-11-18 NOTE — Progress Notes (Signed)
Cardiology Office Note  Date: 11/18/2023   ID: COBURN KNAUS, DOB 1955-11-17, MRN [id number]  PCP:  Elfredia Nevins, MD  Cardiologist:  Marjo Bicker, MD Electrophysiologist:  None   History of Present Illness: Duane Porter is a 68 y.o. male with no significant past medical history was referred to cardiology clinic for evaluation of nonspecific chest pain.  Patient reports hollow sensation in his chest after eating.  The symptoms got better after starting PPI, cutting back on coffee and stopping alcohol.  No chest pain with exertion.  He walks 2 miles per day with no symptoms of DOE angina.  Does not have any dizziness, presyncope, syncope or leg swelling.  He has a family history of coronary disease in his father who underwent stenting in his carotids bilaterally.   Past Medical History:  Diagnosis Date   History of 2019 novel coronavirus disease (COVID-19) 02/21/2020   positive result in epic, per pt moderate symptoms (but does not believe he had covid) and stated dx tick born disease , treated and all symptoms resolved   History of adenomatous polyp of colon    Perirectal fistula    Wears glasses     Past Surgical History:  Procedure Laterality Date   COLONOSCOPY N/A 10/29/2013   Procedure: COLONOSCOPY;  Surgeon: Corbin Ade, MD;  [facility];  Service: Endoscopy;  Laterality: N/A;  8:30 AM   COLONOSCOPY N/A 02/21/2019   Procedure: COLONOSCOPY;  Surgeon: Corbin Ade, MD;  [facility];  Service: Endoscopy;  Laterality: N/A;  12:00   COLONOSCOPY     EVALUATION UNDER ANESTHESIA WITH ANAL FISTULECTOMY N/A 01/29/2021   Procedure: EXAM UNDER ANESTHESIA WITH EXCISION OF PERIRECTAL SINUS TRACT;  Surgeon: Karie Soda, MD;  Location: [facility];  Service: General;  Laterality: N/A;   POLYPECTOMY  02/21/2019   Procedure: POLYPECTOMY;  Surgeon: Corbin Ade, MD;  [facility];  Service: Endoscopy;;   RECTAL EXAM UNDER  ANESTHESIA N/A 01/29/2021   Procedure: RECTAL EXAM UNDER ANESTHESIA;  Surgeon: Karie Soda, MD;  Location: [facility] ;  Service: General;  Laterality: N/A;   TONSILLECTOMY AND ADENOIDECTOMY  age 27    Current Outpatient Medications  Medication Sig Dispense Refill   Multiple Vitamin (MULTIVITAMIN WITH MINERALS) TABS tablet Take 1 tablet by mouth daily.     Omega-3 Fatty Acids (FISH OIL) 1200 MG CAPS Take 1,200 mg by mouth daily.     pantoprazole (PROTONIX) 40 MG tablet Take 40 mg by mouth 2 (two) times daily.     diazepam (VALIUM) 5 MG tablet Take 1 tablet (5 mg total) by mouth every 6 (six) hours as needed for muscle spasms (difficulty urinating). (Patient not taking: Reported on 11/18/2023) 8 tablet 2   oxyCODONE (OXY IR/ROXICODONE) 5 MG immediate release tablet Take 1-2 tablets (5-10 mg total) by mouth every 6 (six) hours as needed for moderate pain, severe pain or breakthrough pain. (Patient not taking: Reported on 11/18/2023) 30 tablet 0   No current facility-administered medications for this visit.   Allergies:  Patient has no known allergies.   Social History: The patient  reports that he quit smoking about 22 years ago. His smoking use included cigarettes. He started smoking about 27 years ago. He has never used smokeless tobacco. He reports current alcohol use of about 7.0 standard drinks of alcohol per week. He reports that he does not use drugs.   Family History: The patient's family  history includes Colon cancer in his paternal aunt and another family member; Hypertension in his mother; Stroke in his father.   ROS:  Please see the history of present illness. Otherwise, complete review of systems is positive for none.  All other systems are reviewed and negative.   Physical Exam: VS:  BP (!) 168/98 (BP Location: Right Arm, Patient Position: Sitting, Cuff Size: Normal)   Pulse 70   Ht 5\' 11"  (1.803 m)   Wt 186 lb (84.4 kg)   BMI 25.94 kg/m , BMI Body mass index is  25.94 kg/m.  Wt Readings from Last 3 Encounters:  11/18/23 186 lb (84.4 kg)  01/29/21 169 lb 9.6 oz (76.9 kg)  02/21/20 180 lb (81.6 kg)    General: Patient appears comfortable at rest. HEENT: Conjunctiva and lids normal, oropharynx clear with moist mucosa. Neck: Supple, no elevated JVP or carotid bruits, no thyromegaly. Lungs: Clear to auscultation, nonlabored breathing at rest. Cardiac: Regular rate and rhythm, no S3 or significant systolic murmur, no pericardial rub. Abdomen: Soft, nontender, no hepatomegaly, bowel sounds present, no guarding or rebound. Extremities: No pitting edema, distal pulses 2+. Skin: Warm and dry. Musculoskeletal: No kyphosis. Neuropsychiatric: Alert and oriented x3, affect grossly appropriate.  Recent Labwork: No results found for requested labs within last 365 days.  No results found for: "CHOL", "TRIG", "HDL", "CHOLHDL", "VLDL", "LDLCALC", "LDLDIRECT"   Assessment and Plan:  Noncardiac chest pain: Patient reports hollow sensation in his chest after eating.  This got better after he was started on pantoprazole, cut back on coffee and stopped drinking alcohol.  No chest pain with exertion, he walks for 2 miles daily with no symptoms of angina or DOE.  No further cardiac workup at this time.  Family history of carotid artery stenosis s/p bilateral stents in his father: No carotid bruits.  Patient wants to get carotid ultrasound due to family history.  Will obtain ultrasound carotid bilateral.    Medication Adjustments/Labs and Tests Ordered: Current medicines are reviewed at length with the patient today.  Concerns regarding medicines are outlined above.    Disposition:  Follow up pending results  Signed, Adelma Bowdoin Verne Spurr, MD, 11/18/2023 3:52 PM    [facility] [address], [place], [place] [address]

## 2023-11-18 NOTE — Patient Instructions (Signed)
Medication Instructions:  Your physician recommends that you continue on your current medications as directed. Please refer to the Current Medication list given to you today.  *If you need a refill on your cardiac medications before your next appointment, please call your pharmacy*   Lab Work: None If you have labs (blood work) drawn today and your tests are completely normal, you will receive your results only by: MyChart Message (if you have MyChart) OR A paper copy in the mail If you have any lab test that is abnormal or we need to change your treatment, we will call you to review the results.   Testing/Procedures: Your physician has requested that you have a carotid duplex. This test is an ultrasound of the carotid arteries in your neck. It looks at blood flow through these arteries that supply the brain with blood. Allow one hour for this exam. There are no restrictions or special instructions.    Follow-Up: At [facility], you and your health needs are our priority.  As part of our continuing mission to provide you with exceptional heart care, we have created designated Provider Care Teams.  These Care Teams include your primary Cardiologist (physician) and Advanced Practice Providers (APPs -  Physician Assistants and Nurse Practitioners) who all work together to provide you with the care you need, when you need it.  We recommend signing up for the patient portal called "MyChart".  Sign up information is provided on this After Visit Summary.  MyChart is used to connect with patients for Virtual Visits (Telemedicine).  Patients are able to view lab/test results, encounter notes, upcoming appointments, etc.  Non-urgent messages can be sent to your provider as well.   To learn more about what you can do with MyChart, go to [email or web address].    Your next appointment:    Follow up to be determined based on testing results  Provider:   You may see Vishnu P Mallipeddi, MD  or one of the following Advanced Practice Providers on your designated Care Team:   [place], PA-C  Jacolyn Reedy, [place]     Other Instructions

## 2023-12-02 ENCOUNTER — Ambulatory Visit (HOSPITAL_COMMUNITY)
Admission: RE | Admit: 2023-12-02 | Discharge: 2023-12-02 | Disposition: A | Discharge status: Home / Self Care | Payer: Self-pay | Source: Ambulatory Visit | Attending: Internal Medicine | Admitting: Internal Medicine

## 2023-12-02 ENCOUNTER — Ambulatory Visit (HOSPITAL_COMMUNITY)
Admission: RE | Admit: 2023-12-02 | Discharge: 2023-12-02 | Disposition: A | Discharge status: Home / Self Care | Payer: HMO | Source: Ambulatory Visit | Attending: Internal Medicine | Admitting: Internal Medicine

## 2023-12-02 DIAGNOSIS — I779 Disorder of arteries and arterioles, unspecified: Secondary | ICD-10-CM | POA: Diagnosis not present

## 2023-12-02 DIAGNOSIS — E7849 Other hyperlipidemia: Secondary | ICD-10-CM | POA: Insufficient documentation

## 2023-12-02 DIAGNOSIS — R0789 Other chest pain: Secondary | ICD-10-CM | POA: Insufficient documentation

## 2023-12-02 DIAGNOSIS — I2510 Atherosclerotic heart disease of native coronary artery without angina pectoris: Secondary | ICD-10-CM | POA: Insufficient documentation

## 2023-12-02 DIAGNOSIS — I6523 Occlusion and stenosis of bilateral carotid arteries: Secondary | ICD-10-CM | POA: Diagnosis not present

## 2023-12-05 ENCOUNTER — Other Ambulatory Visit: Payer: Self-pay

## 2023-12-05 DIAGNOSIS — E663 Overweight: Secondary | ICD-10-CM | POA: Diagnosis not present

## 2023-12-05 DIAGNOSIS — I6529 Occlusion and stenosis of unspecified carotid artery: Secondary | ICD-10-CM | POA: Diagnosis not present

## 2023-12-05 DIAGNOSIS — Z6826 Body mass index (BMI) 26.0-26.9, adult: Secondary | ICD-10-CM | POA: Diagnosis not present

## 2023-12-05 DIAGNOSIS — I6523 Occlusion and stenosis of bilateral carotid arteries: Secondary | ICD-10-CM

## 2023-12-06 DIAGNOSIS — E7849 Other hyperlipidemia: Secondary | ICD-10-CM | POA: Diagnosis not present

## 2024-01-02 ENCOUNTER — Ambulatory Visit (HOSPITAL_COMMUNITY)
Admission: RE | Admit: 2024-01-02 | Discharge: 2024-01-02 | Disposition: A | Discharge status: Home / Self Care | Source: Ambulatory Visit | Attending: Internal Medicine | Admitting: Internal Medicine

## 2024-01-02 DIAGNOSIS — I6523 Occlusion and stenosis of bilateral carotid arteries: Secondary | ICD-10-CM | POA: Diagnosis not present

## 2024-01-02 MED ORDER — IOHEXOL 350 MG/ML SOLN
75.0000 mL | Freq: Once | INTRAVENOUS | Status: AC | PRN
  Administered 2024-01-02: 75 mL via INTRAVENOUS

## 2024-01-20 ENCOUNTER — Ambulatory Visit: Payer: HMO | Admitting: Internal Medicine

## 2024-02-02 ENCOUNTER — Telehealth: Payer: Self-pay

## 2024-02-02 MED ORDER — ASPIRIN 81 MG PO TBEC: 81.0000 mg | DELAYED_RELEASE_TABLET | Freq: Every day | ORAL | 3 refills | Status: AC

## 2024-02-02 MED ORDER — ROSUVASTATIN CALCIUM 10 MG PO TABS: 10.0000 mg | ORAL_TABLET | Freq: Every day | ORAL | 3 refills | Status: AC

## 2024-02-02 NOTE — Telephone Encounter (Signed)
 The patient has been notified of the result and verbalized understanding.  All questions (if any) were answered. Casper Clement, CMA 02/02/2024 1:11 PM

## 2024-02-02 NOTE — Telephone Encounter (Signed)
-----   Message from Vishnu P Mallipeddi sent at 02/02/2024 12:18 PM EDT ----- Patient was supposed to get ultrasound carotid artery Doppler and not CT angio neck.  He has moderate stenosis of internal carotid arteries.  Start aspirin 81 mg once daily and moderate intensity statin, rosuvastatin 10 mg nightly.  His LDL and TG are severely elevated.  Schedule follow-up in 1 year.

## 2024-02-08 ENCOUNTER — Encounter: Payer: Self-pay | Admitting: *Deleted

## 2024-02-17 ENCOUNTER — Telehealth: Payer: Self-pay | Admitting: *Deleted

## 2024-02-17 NOTE — Telephone Encounter (Signed)
  Procedure: COLONOSCOPY  Height: 5'11 Weight: 185LBS        Have you had a colonoscopy before?  01/2019, Dr. Riley Cheadle  Do you have family history of colon cancer?  YES COUSINS, AUNTS  Do you have a family history of polyps? YES  Previous colonoscopy with polyps removed? YES  Do you have a history colorectal cancer?   NO  Are you diabetic?  NO  Do you have a prosthetic or mechanical heart valve? NO  Do you have a pacemaker/defibrillator?   NO  Have you had endocarditis/atrial fibrillation?  NO  Do you use supplemental oxygen/CPAP?  NO  Have you had joint replacement within the last 12 months?  NO  Do you tend to be constipated or have to use laxatives?  NO   Do you have history of alcohol use? If yes, how much and how often.  YES  Do you have history or are you using drugs? If yes, what do are you  using?  NO  Have you ever had a stroke/heart attack?  NO  Have you ever had a heart or other vascular stent placed,?NO  Do you take weight loss medication? NO  Do you take any blood-thinning medications such as: (Plavix, aspirin , Coumadin, Aggrenox, Brilinta, Xarelto, Eliquis, Pradaxa, Savaysa or Effient)? ASPIRIN  81MG   If yes we need the name, milligram, dosage and who is prescribing doctor:               Current Outpatient Medications  Medication Sig Dispense Refill   aspirin  EC 81 MG tablet Take 1 tablet (81 mg total) by mouth daily. Swallow whole. 90 tablet 3   pantoprazole (PROTONIX) 40 MG tablet Take 40 mg by mouth 2 (two) times daily.     rosuvastatin  (CRESTOR ) 10 MG tablet Take 1 tablet (10 mg total) by mouth daily. 90 tablet 3   No current facility-administered medications for this visit.    No Known Allergies

## 2024-02-17 NOTE — Telephone Encounter (Signed)
ASA 2.  ?

## 2024-02-22 NOTE — Telephone Encounter (Signed)
 LMOVM to call back

## 2024-02-23 MED ORDER — NA SULFATE-K SULFATE-MG SULF 17.5-3.13-1.6 GM/177ML PO SOLN: 1.0000 | Freq: Once | ORAL | 0 refills | Status: AC

## 2024-02-23 NOTE — Telephone Encounter (Signed)
 Questionnaire from recall, no referral needed

## 2024-02-23 NOTE — Addendum Note (Signed)
 Addended by: Feliz Hosteller on: 02/23/2024 03:19 PM   Modules accepted: Orders

## 2024-02-23 NOTE — Telephone Encounter (Signed)
 Spoke with pt. He has been scheduled for 6/26 with Dr. Riley Cheadle. Aware will send instructions. Rx for prep to be sent to pharmacy.

## 2024-03-22 ENCOUNTER — Ambulatory Visit (HOSPITAL_COMMUNITY)
Admission: RE | Admit: 2024-03-22 | Discharge: 2024-03-22 | Disposition: A | Discharge status: Home / Self Care | Attending: Internal Medicine | Admitting: Internal Medicine

## 2024-03-22 ENCOUNTER — Encounter (HOSPITAL_COMMUNITY)
Admission: RE | Disposition: A | Discharge status: Home / Self Care | Payer: Self-pay | Source: Home / Self Care | Attending: Internal Medicine

## 2024-03-22 ENCOUNTER — Encounter (HOSPITAL_COMMUNITY): Payer: Self-pay | Admitting: Internal Medicine

## 2024-03-22 ENCOUNTER — Other Ambulatory Visit: Payer: Self-pay

## 2024-03-22 ENCOUNTER — Ambulatory Visit (HOSPITAL_COMMUNITY): Payer: Self-pay | Admitting: Anesthesiology

## 2024-03-22 DIAGNOSIS — Z860101 Personal history of adenomatous and serrated colon polyps: Secondary | ICD-10-CM | POA: Diagnosis present

## 2024-03-22 DIAGNOSIS — Z800 Family history of malignant neoplasm of digestive organs: Secondary | ICD-10-CM | POA: Diagnosis not present

## 2024-03-22 DIAGNOSIS — Z1211 Encounter for screening for malignant neoplasm of colon: Secondary | ICD-10-CM | POA: Insufficient documentation

## 2024-03-22 DIAGNOSIS — K635 Polyp of colon: Secondary | ICD-10-CM | POA: Diagnosis not present

## 2024-03-22 DIAGNOSIS — Z87891 Personal history of nicotine dependence: Secondary | ICD-10-CM | POA: Insufficient documentation

## 2024-03-22 DIAGNOSIS — Z8249 Family history of ischemic heart disease and other diseases of the circulatory system: Secondary | ICD-10-CM | POA: Diagnosis not present

## 2024-03-22 DIAGNOSIS — D124 Benign neoplasm of descending colon: Secondary | ICD-10-CM | POA: Diagnosis not present

## 2024-03-22 DIAGNOSIS — K640 First degree hemorrhoids: Secondary | ICD-10-CM | POA: Diagnosis not present

## 2024-03-22 DIAGNOSIS — I10 Essential (primary) hypertension: Secondary | ICD-10-CM | POA: Diagnosis not present

## 2024-03-22 DIAGNOSIS — D123 Benign neoplasm of transverse colon: Secondary | ICD-10-CM | POA: Insufficient documentation

## 2024-03-22 HISTORY — PX: COLONOSCOPY: SHX5424

## 2024-03-22 SURGERY — COLONOSCOPY
Anesthesia: General

## 2024-03-22 MED ORDER — LACTATED RINGERS IV SOLN: INTRAVENOUS | Status: DC

## 2024-03-22 MED ORDER — PROPOFOL 500 MG/50ML IV EMUL
INTRAVENOUS | Status: DC | PRN
Start: 2024-03-22 — End: 2024-03-22
  Administered 2024-03-22: 200 ug/kg/min via INTRAVENOUS

## 2024-03-22 MED ORDER — PROPOFOL 10 MG/ML IV BOLUS
INTRAVENOUS | Status: DC | PRN
  Administered 2024-03-22: 100 mg via INTRAVENOUS

## 2024-03-22 NOTE — Anesthesia Preprocedure Evaluation (Signed)

## 2024-03-22 NOTE — H&P (Addendum)
 @LOGO @   Primary Care Physician:  Bertell Satterfield, MD Primary Gastroenterologist:  Dr. Shaaron  Pre-Procedure History & Physical: HPI:  Duane Porter is a 68 y.o. male here for surveillance colonoscopy history of multiple colonic adenomas removed 2020.  Positive family history of colon cancer in at least 1 second and 1/3 degree relative.  History of Lynch in his family but patient tested negative.  Past Medical History:  Diagnosis Date   History of 2019 novel coronavirus disease (COVID-19) 02/21/2020   positive result in epic, per pt moderate symptoms (but does not believe he had covid) and stated dx tick born disease , treated and all symptoms resolved   History of adenomatous polyp of colon    Perirectal fistula    Wears glasses     Past Surgical History:  Procedure Laterality Date   COLONOSCOPY N/A 10/29/2013   Procedure: COLONOSCOPY;  Surgeon: Lamar CHRISTELLA Shaaron, MD;  [facility];  Service: Endoscopy;  Laterality: N/A;  8:30 AM   COLONOSCOPY N/A 02/21/2019   Procedure: COLONOSCOPY;  Surgeon: Shaaron Lamar CHRISTELLA, MD;  [facility];  Service: Endoscopy;  Laterality: N/A;  12:00   COLONOSCOPY     EVALUATION UNDER ANESTHESIA WITH ANAL FISTULECTOMY N/A 01/29/2021   Procedure: EXAM UNDER ANESTHESIA WITH EXCISION OF PERIRECTAL SINUS TRACT;  Surgeon: Sheldon Standing, MD;  Location: [facility];  Service: General;  Laterality: N/A;   POLYPECTOMY  02/21/2019   Procedure: POLYPECTOMY;  Surgeon: Shaaron Lamar CHRISTELLA, MD;  [facility];  Service: Endoscopy;;   RECTAL EXAM UNDER ANESTHESIA N/A 01/29/2021   Procedure: RECTAL EXAM UNDER ANESTHESIA;  Surgeon: Sheldon Standing, MD;  Location: [facility];  Service: General;  Laterality: N/A;   TONSILLECTOMY AND ADENOIDECTOMY  age 78    Prior to Admission medications   Medication Sig Start Date End Date Taking? Authorizing Provider  aspirin  EC 81 MG tablet Take 1 tablet (81 mg total) by mouth daily. Swallow  whole. 02/02/24  Yes Mallipeddi, Vishnu P, MD  Cholecalciferol (VITAMIN D3) 50 MCG (2000 UT) CAPS Take 1 capsule by mouth daily.   Yes [provider]  Multiple Vitamins-Minerals (DAILY MULTIVITAMIN PO) Take 1 tablet by mouth daily. [place] multivitamin   Yes [provider]  Omega-3 Fatty Acids (FISH OIL PO) Take 1 capsule by mouth daily.   Yes [provider]  pantoprazole (PROTONIX) 40 MG tablet Take 40 mg by mouth 2 (two) times daily. 10/05/23  Yes [provider]  psyllium (METAMUCIL) 58.6 % packet Take 1 packet by mouth daily.   Yes [provider]  rosuvastatin  (CRESTOR ) 10 MG tablet Take 1 tablet (10 mg total) by mouth daily. 02/02/24 05/02/24 Yes Mallipeddi, Diannah SQUIBB, MD    Allergies as of 02/23/2024   (No Known Allergies)    Family History  Problem Relation Age of Onset   Hypertension Mother    Stroke Father    Colon cancer Paternal Aunt    Colon cancer Other     Social History   Socioeconomic History   Marital status: Married    Spouse name: Not on file   Number of children: Not on file   Years of education: Not on file   Highest education level: Not on file  Occupational History   Not on file  Tobacco Use   Smoking status: Former    Current packs/day: 0.00    Types: Cigarettes    Start date: 01/29/1996    Quit  date: 01/28/2001    Years since quitting: 23.1   Smokeless tobacco: Never  Vaping Use   Vaping status: Never Used  Substance and Sexual Activity   Alcohol use: Not Currently    Alcohol/week: 7.0 standard drinks of alcohol    Types: 7 Standard drinks or equivalent per week    Comment: per pt one drink per day   Drug use: Never   Sexual activity: Not on file  Other Topics Concern   Not on file  Social History Narrative   Not on file   Social Drivers of Health   Financial Resource Strain: Not on file  Food Insecurity: Not on file  Transportation Needs: Not on file  Physical Activity: Not on file  Stress: Not  on file  Social Connections: Not on file  Intimate Partner Violence: Not on file    Review of Systems: See HPI, otherwise negative ROS  Physical Exam: BP (!) 158/83   Pulse 62   Temp 97.9 F (36.6 C) (Oral)   Resp 13   Ht 5' 11 (1.803 m)   Wt 81.6 kg   SpO2 100%   BMI 25.10 kg/m  General:   Alert,  Well-developed, well-nourished, pleasant and cooperative in NAD Neck:  Supple; no masses or thyromegaly. No significant cervical adenopathy. Lungs:  Clear throughout to auscultation.   No wheezes, crackles, or rhonchi. No acute distress. Heart:  Regular rate and rhythm; no murmurs, clicks, rubs,  or gallops. Abdomen: Non-distended, normal bowel sounds.  Soft and nontender without appreciable mass or hepatosplenomegaly.   Impression/Plan: 68 year old gentleman with a history of colonic adenoma.  Here for surveillance colonoscopy. The risks, benefits, limitations, alternatives and imponderables have been reviewed with the patient. Questions have been answered. All parties are agreeable.

## 2024-03-22 NOTE — Transfer of Care (Signed)
 Immediate Anesthesia Transfer of Care Note  Patient: Duane Porter  Procedure(s) Performed: COLONOSCOPY  Patient Location: Endoscopy Unit  Anesthesia Type:General  Level of Consciousness: awake, alert , oriented, and patient cooperative  Airway & Oxygen Therapy: Patient Spontanous Breathing  Post-op Assessment: Report given to RN, Post -op Vital signs reviewed and stable, and Patient moving all extremities X 4  Post vital signs: Reviewed and stable  Last Vitals:  Vitals Value Taken Time  BP 118/61 03/22/24 13:58  Temp 36.4 C 03/22/24 13:58  Pulse 67 03/22/24 13:58  Resp 12 03/22/24 13:58  SpO2 98 % 03/22/24 13:58    Last Pain:  Vitals:   03/22/24 1358  TempSrc: Oral  PainSc:       Patients Stated Pain Goal: 6 (03/22/24 1136)  Complications: No notable events documented.

## 2024-03-22 NOTE — Op Note (Signed)
 [facility] Patient Name: Duane Porter Procedure Date: 03/22/2024 1:14 PM MRN: [id number] Date of Birth: 06/10/1956 Attending MD: Lamar Ozell Hollingshead , MD, [id number] CSN: [id number] Age: 68 Admit Type: Outpatient Procedure:                Colonoscopy Indications:              High risk colon cancer surveillance: Personal                            history of colonic polyps Providers:                Lamar Ozell Hollingshead, MD, Rosina Sprague, Crystal                            Page, Jon Loge Referring MD:              Medicines:                Propofol  per Anesthesia Complications:            No immediate complications. Estimated Blood Loss:     Estimated blood loss was minimal. Procedure:                Pre-Anesthesia Assessment:                           - Prior to the procedure, a History and Physical                            was performed, and patient medications and                            allergies were reviewed. The patient's tolerance of                            previous anesthesia was also reviewed. The risks                            and benefits of the procedure and the sedation                            options and risks were discussed with the patient.                            All questions were answered, and informed consent                            was obtained. ASA Grade Assessment: II - A patient                            with mild systemic disease. After reviewing the                            risks and benefits, the patient was deemed in  satisfactory condition to undergo the procedure.                           After obtaining informed consent, the colonoscope                            was passed under direct vision. Throughout the                            procedure, the patient's blood pressure, pulse, and                            oxygen saturations were monitored continuously. The                             [id number]) scope was introduced through the                            anus and advanced to the the cecum, identified by                            appendiceal orifice and ileocecal valve. The                            colonoscopy was performed without difficulty. The                            patient tolerated the procedure well. The quality                            of the bowel preparation was adequate. Scope In: 1:40:17 PM Scope Out: 1:54:01 PM Scope Withdrawal Time: 0 hours 9 minutes 13 seconds  Total Procedure Duration: 0 hours 13 minutes 44 seconds  Findings:      The perianal and digital rectal examinations were normal.      Non-bleeding internal hemorrhoids were found during retroflexion. The       hemorrhoids were moderate, medium-sized and Grade I (internal       hemorrhoids that do not prolapse).      Three sessile polyps were found in the descending colon and hepatic       flexure. The polyps were 4 to 5 mm in size. These polyps were removed       with a cold snare. Resection and retrieval were complete. Estimated       blood loss was minimal.      The exam was otherwise without abnormality on direct and retroflexion       views. Impression:               - Non-bleeding internal hemorrhoids.                           - Three 4 to 5 mm polyps in the descending colon                            and at the hepatic flexure, removed with a cold  snare. Resected and retrieved.                           - The examination was otherwise normal on direct                            and retroflexion views. Moderate Sedation:      Moderate (conscious) sedation was personally administered by an       anesthesia professional. The following parameters were monitored: oxygen       saturation, heart rate, blood pressure, respiratory rate, EKG, adequacy       of pulmonary ventilation, and response to care. Recommendation:           - Patient has a contact  number available for                            emergencies. The signs and symptoms of potential                            delayed complications were discussed with the                            patient. Return to normal activities tomorrow.                            Written discharge instructions were provided to the                            patient.                           - Advance diet as tolerated.                           - Continue present medications.                           - Repeat colonoscopy date to be determined after                            pending pathology results are reviewed for                            surveillance.                           - Return to GI office (date not yet determined). Procedure Code(s):        --- Professional ---                           [phone], Colonoscopy, flexible; with removal of                            tumor(s), polyp(s), or other lesion(s) by snare  technique Diagnosis Code(s):        --- Professional ---                           Z86.010, Personal history of colonic polyps                           D12.4, Benign neoplasm of descending colon                           D12.3, Benign neoplasm of transverse colon (hepatic                            flexure or splenic flexure)                           K64.0, First degree hemorrhoids CPT copyright 2022 American Medical Association. All rights reserved. The codes documented in this report are preliminary and upon coder review may  be revised to meet current compliance requirements. Lamar HERO. Yitty Roads, MD Lamar Ozell Hollingshead, MD 03/22/2024 2:02:22 PM This report has been signed electronically. Number of Addenda: 0

## 2024-03-22 NOTE — Discharge Instructions (Addendum)
  Colonoscopy Discharge Instructions  Read the instructions outlined below and refer to this sheet in the next few weeks. These discharge instructions provide you with general information on caring for yourself after you leave the hospital. Your doctor may also give you specific instructions. While your treatment has been planned according to the most current medical practices available, unavoidable complications occasionally occur. If you have any problems or questions after discharge, call Dr. Shaaron at [phone]. ACTIVITY You may resume your regular activity, but move at a slower pace for the next 24 hours.  Take frequent rest periods for the next 24 hours.  Walking will help get rid of the air and reduce the bloated feeling in your belly (abdomen).  No driving for 24 hours (because of the medicine (anesthesia) used during the test).   Do not sign any important legal documents or operate any machinery for 24 hours (because of the anesthesia used during the test).  NUTRITION Drink plenty of fluids.  You may resume your normal diet as instructed by your doctor.  Begin with a light meal and progress to your normal diet. Heavy or fried foods are harder to digest and may make you feel sick to your stomach (nauseated).  Avoid alcoholic beverages for 24 hours or as instructed.  MEDICATIONS You may resume your normal medications unless your doctor tells you otherwise.  WHAT YOU CAN EXPECT TODAY Some feelings of bloating in the abdomen.  Passage of more gas than usual.  Spotting of blood in your stool or on the toilet paper.  IF YOU HAD POLYPS REMOVED DURING THE COLONOSCOPY: No aspirin  products for 7 days or as instructed.  No alcohol for 7 days or as instructed.  Eat a soft diet for the next 24 hours.  FINDING OUT THE RESULTS OF YOUR TEST Not all test results are available during your visit. If your test results are not back during the visit, make an appointment with your caregiver to find out the  results. Do not assume everything is normal if you have not heard from your caregiver or the medical facility. It is important for you to follow up on all of your test results.  SEEK IMMEDIATE MEDICAL ATTENTION IF: You have more than a spotting of blood in your stool.  Your belly is swollen (abdominal distention).  You are nauseated or vomiting.  You have a temperature over 101.  You have abdominal pain or discomfort that is severe or gets worse throughout the day.     3 small polyps found and removed today  Further recommendations to follow pending review of pathology report

## 2024-03-23 ENCOUNTER — Encounter (HOSPITAL_COMMUNITY): Payer: Self-pay | Admitting: Internal Medicine

## 2024-03-23 LAB — SURGICAL PATHOLOGY

## 2024-03-23 NOTE — Anesthesia Postprocedure Evaluation (Signed)
 Anesthesia Post Note  Patient: Duane Porter  Procedure(s) Performed: COLONOSCOPY  Patient location during evaluation: Phase II Anesthesia Type: General Level of consciousness: awake Pain management: pain level controlled Vital Signs Assessment: post-procedure vital signs reviewed and stable Respiratory status: spontaneous breathing and respiratory function stable Cardiovascular status: blood pressure returned to baseline and stable Postop Assessment: no headache and no apparent nausea or vomiting Anesthetic complications: no Comments: Late entry   No notable events documented.   Last Vitals:  Vitals:   03/22/24 1136 03/22/24 1358  BP: (!) 158/83 118/61  Pulse: 62 67  Resp: 13 12  Temp: 36.6 C (!) 36.4 C  SpO2: 100% 98%    Last Pain:  Vitals:   03/22/24 1401  TempSrc:   PainSc: 0-No pain                 Yvonna JINNY Bosworth

## 2024-03-25 ENCOUNTER — Ambulatory Visit: Payer: Self-pay | Admitting: Internal Medicine

## 2024-04-24 DIAGNOSIS — H43393 Other vitreous opacities, bilateral: Secondary | ICD-10-CM | POA: Diagnosis not present

## 2024-06-19 ENCOUNTER — Ambulatory Visit: Admitting: Internal Medicine

## 2024-06-19 ENCOUNTER — Encounter: Payer: Self-pay | Admitting: Internal Medicine

## 2024-06-19 VITALS — BP 142/85 | HR 63 | Temp 97.3°F | Ht 71.0 in | Wt 190.2 lb

## 2024-06-19 DIAGNOSIS — K219 Gastro-esophageal reflux disease without esophagitis: Secondary | ICD-10-CM

## 2024-06-19 DIAGNOSIS — R194 Change in bowel habit: Secondary | ICD-10-CM | POA: Diagnosis not present

## 2024-06-19 NOTE — Patient Instructions (Signed)
 Nice to see you again today  My recommendations are as follows:  -Take pantoprazole 40 mg every day best taken 30 minutes before breakfast.  - Stop taking the fish oil for the time being  - Continue Metamucil  - Take digestive advantage for gas bloat-a probiotic.  Take every day.  - No need for any testing at this time  -Office visit with me in 3 months  - At some point between now and next year you should undergo an upper endoscopy to look at your esophagus to assess for damage due to lifelong acid reflux

## 2024-06-19 NOTE — Progress Notes (Unsigned)
 Gastroenterology Progress Note    Primary Care Physician:  Bertell Satterfield, MD Primary Gastroenterologist:  Dr.   Pre-Procedure History & Physical: HPI:  Duane Porter is a 68 y.o. male here for follow-up of mucous stools gas.  Colonoscopy done in June revealed hyperplastic and adenomatous polyp.  Patient states since April when he went to [place]  to visit friends and came back he started to have mucus-like stools and gas no more than 1 3 formed bowel movements daily.  Not having diarrhea no blood.  He did not mention this to the time his screening colonoscopy.  Takes Metamucil daily also takes a fish oil.  Also notes he has reflux couple times a week but does not take pantoprazole regularly.  No prior EGD states he has had reflux since his 64s.  No alarm symptoms such as dysphagia.  Takes pantoprazole on an as needed basis.  Past Medical History:  Diagnosis Date   History of 2019 novel coronavirus disease (COVID-19) 02/21/2020   positive result in epic, per pt moderate symptoms (but does not believe he had covid) and stated dx tick born disease , treated and all symptoms resolved   History of adenomatous polyp of colon    Perirectal fistula    Wears glasses     Past Surgical History:  Procedure Laterality Date   COLONOSCOPY N/A 10/29/2013   Procedure: COLONOSCOPY;  Surgeon: Lamar CHRISTELLA Hollingshead, MD;  Location: [facility];  Service: Endoscopy;  Laterality: N/A;  8:30 AM   COLONOSCOPY N/A 02/21/2019   Procedure: COLONOSCOPY;  Surgeon: Hollingshead Lamar CHRISTELLA, MD;  Location: [facility];  Service: Endoscopy;  Laterality: N/A;  12:00   COLONOSCOPY     COLONOSCOPY N/A 03/22/2024   Procedure: COLONOSCOPY;  Surgeon: Hollingshead Lamar CHRISTELLA, MD;  Location: [facility];  Service: Endoscopy;  Laterality: N/A;  1245pm, asa 2   EVALUATION UNDER ANESTHESIA WITH ANAL FISTULECTOMY N/A 01/29/2021   Procedure: EXAM UNDER ANESTHESIA WITH EXCISION OF PERIRECTAL SINUS TRACT;  Surgeon: Sheldon Standing, MD;  Location: [facility];  Service: General;  Laterality: N/A;   POLYPECTOMY  02/21/2019   Procedure: POLYPECTOMY;  Surgeon: Hollingshead Lamar CHRISTELLA, MD;  Location: [facility];  Service: Endoscopy;;   RECTAL EXAM UNDER ANESTHESIA N/A 01/29/2021   Procedure: RECTAL EXAM UNDER ANESTHESIA;  Surgeon: Sheldon Standing, MD;  Location: [facility];  Service: General;  Laterality: N/A;   TONSILLECTOMY AND ADENOIDECTOMY  age 63    Prior to Admission medications   Medication Sig Start Date End Date Taking? Authorizing Provider  aspirin  EC 81 MG tablet Take 1 tablet (81 mg total) by mouth daily. Swallow whole. 02/02/24  Yes Mallipeddi, Vishnu P, MD  Cholecalciferol (VITAMIN D3) 50 MCG (2000 UT) CAPS Take 1 capsule by mouth daily.   Yes [provider]  Multiple Vitamins-Minerals (DAILY MULTIVITAMIN PO) Take 1 tablet by mouth daily. [place] multivitamin   Yes [provider]  Omega-3 Fatty Acids (FISH OIL PO) Take 1 capsule by mouth daily.   Yes [provider]  pantoprazole (PROTONIX) 40 MG tablet Take 40 mg by mouth 2 (two) times daily. Patient taking differently: Take 40 mg by mouth daily as needed. 10/05/23  Yes [provider]  psyllium (METAMUCIL) 58.6 % packet Take 1 packet by mouth daily.   Yes [provider]  rosuvastatin  (CRESTOR ) 10 MG tablet Take 1 tablet (10 mg total) by mouth daily. 02/02/24 06/19/24 Yes Mallipeddi, Diannah SQUIBB, MD  Allergies as of 06/19/2024   (No Known Allergies)    Family History  Problem Relation Age of Onset   Hypertension Mother    Stroke Father    Colon cancer Paternal Aunt    Colon cancer Other     Social History   Socioeconomic History   Marital status: Married    Spouse name: Not on file   Number of children: Not on file   Years of education: Not on file   Highest education level: Not on file  Occupational History   Not on file  Tobacco Use   Smoking status: Former    Current packs/day: 0.00    Types:  Cigarettes    Start date: 01/29/1996    Quit date: 01/28/2001    Years since quitting: 23.4   Smokeless tobacco: Never  Vaping Use   Vaping status: Never Used  Substance and Sexual Activity   Alcohol use: Not Currently    Alcohol/week: 7.0 standard drinks of alcohol    Types: 7 Standard drinks or equivalent per week    Comment: per pt one drink per day   Drug use: Never   Sexual activity: Not on file  Other Topics Concern   Not on file  Social History Narrative   Not on file   Social Drivers of Health   Financial Resource Strain: Not on file  Food Insecurity: Not on file  Transportation Needs: Not on file  Physical Activity: Not on file  Stress: Not on file  Social Connections: Not on file  Intimate Partner Violence: Not on file    Review of Systems   See HPI, otherwise negative ROS  Physical Exam: BP (!) 142/85 (BP Location: Right Arm, Patient Position: Sitting, Cuff Size: Normal)   Pulse 63   Temp (!) 97.3 F (36.3 C) (Oral)   Ht 5' 11 (1.803 m)   Wt 190 lb 3.2 oz (86.3 kg)   SpO2 100%   BMI 26.53 kg/m  General:   Alert,  Well-developed, well-nourished, pleasant and cooperative in NAD Neck:  Supple; no masses or thyromegaly. No significant cervical adenopathy. Lungs:  Clear throughout to auscultation.   No wheezes, crackles, or rhonchi. No acute distress. Heart:  Regular rate and rhythm; no murmurs, clicks, rubs,  or gallops. Abdomen: Non-distended, normal bowel sounds.  Soft and nontender without appreciable mass or hepatosplenomegaly.    Impression/Plan:   68 year old gentleman with the complaint of mucus in stools with gas since April of this year no change in bowel movements otherwise per se 1-3 formed bowel movements daily.  No diarrhea.  No significant abdominal pain as stated, colonoscopy findings as above.  His bowel symptoms are somewhat nonspecific and not suggestive of a infectious process.  May be altered microbiome possibly related to travel and  dietary alteration during his trip.  Fishel may be a contributing factor.  Since no diarrhea stool studies likely not going to be helpful.  Colonoscopy findings reassuring.  Lifelong GERD dependent on once daily PPI.  Reviewed the chronic nature of the disease.  The need for take PPI once daily emphasized.  He does not have any alarm features although with longstanding GERD he ought to have a one-time EGD at some point in the near future  Recommendations:  -Take pantoprazole 40 mg every day best taken 30 minutes before breakfast.  - Stop taking the fish oil for the time being  - Continue Metamucil  - Take digestive advantage for gas bloat-a probiotic.  Take every  day.  - No need for any testing at this time  -Office visit with me in 3 months  - At some point between now and next year you should undergo an upper endoscopy to look at your esophagus to assess for damage due to lifelong acid reflux  Notice: This dictation was prepared with Dragon dictation along with smaller phrase technology. Any transcriptional errors that result from this process are unintentional and may not be corrected upon review.

## 2024-08-21 ENCOUNTER — Encounter: Payer: Self-pay | Admitting: Internal Medicine
